# Patient Record
Sex: Female | Born: 1935 | Race: White | Hispanic: No | Smoking: Never smoker
Health system: Southern US, Community
[De-identification: ages and names within clinical notes are randomized; demographics above are authoritative.]

## PROBLEM LIST (undated history)

## (undated) DIAGNOSIS — J449 Chronic obstructive pulmonary disease, unspecified: Secondary | ICD-10-CM

## (undated) DIAGNOSIS — I10 Essential (primary) hypertension: Secondary | ICD-10-CM

## (undated) DIAGNOSIS — F0390 Unspecified dementia without behavioral disturbance: Secondary | ICD-10-CM

## (undated) DIAGNOSIS — M810 Age-related osteoporosis without current pathological fracture: Secondary | ICD-10-CM

## (undated) HISTORY — PX: BACK SURGERY: SHX140

---

## 2005-05-29 ENCOUNTER — Ambulatory Visit: Payer: Self-pay | Admitting: Unknown Physician Specialty

## 2007-09-08 ENCOUNTER — Ambulatory Visit: Payer: Self-pay | Admitting: Unknown Physician Specialty

## 2007-09-12 ENCOUNTER — Ambulatory Visit: Payer: Self-pay | Admitting: Unknown Physician Specialty

## 2007-09-19 ENCOUNTER — Ambulatory Visit: Payer: Self-pay | Admitting: Unknown Physician Specialty

## 2008-03-25 ENCOUNTER — Ambulatory Visit: Payer: Self-pay | Admitting: Cardiology

## 2008-03-25 ENCOUNTER — Ambulatory Visit: Payer: Self-pay | Admitting: Ophthalmology

## 2008-04-05 ENCOUNTER — Ambulatory Visit: Payer: Self-pay | Admitting: Ophthalmology

## 2008-05-30 ENCOUNTER — Emergency Department: Payer: Self-pay | Admitting: Emergency Medicine

## 2008-08-26 ENCOUNTER — Inpatient Hospital Stay: Payer: Self-pay | Admitting: Internal Medicine

## 2008-12-12 ENCOUNTER — Inpatient Hospital Stay: Payer: Self-pay | Admitting: Internal Medicine

## 2009-03-13 ENCOUNTER — Inpatient Hospital Stay: Payer: Self-pay | Admitting: Internal Medicine

## 2009-07-27 ENCOUNTER — Ambulatory Visit: Payer: Self-pay | Admitting: Ophthalmology

## 2010-04-09 ENCOUNTER — Inpatient Hospital Stay: Payer: Self-pay | Admitting: Internal Medicine

## 2010-07-31 ENCOUNTER — Inpatient Hospital Stay: Payer: Self-pay | Admitting: Internal Medicine

## 2010-08-07 ENCOUNTER — Encounter: Payer: Self-pay | Admitting: Internal Medicine

## 2010-08-08 ENCOUNTER — Encounter: Payer: Self-pay | Admitting: Internal Medicine

## 2010-08-20 ENCOUNTER — Encounter: Payer: Self-pay | Admitting: Internal Medicine

## 2010-09-19 ENCOUNTER — Encounter: Payer: Self-pay | Admitting: Internal Medicine

## 2012-03-03 ENCOUNTER — Emergency Department: Payer: Self-pay | Admitting: Emergency Medicine

## 2012-03-12 ENCOUNTER — Ambulatory Visit: Payer: Self-pay | Admitting: Orthopedic Surgery

## 2012-09-10 ENCOUNTER — Emergency Department: Payer: Self-pay | Admitting: Emergency Medicine

## 2012-09-10 LAB — TROPONIN I: Troponin-I: <0.02 ng/mL

## 2012-09-10 LAB — CBC
HCT: 40.6 % (ref 35.0–47.0)
HGB: 13.6 g/dL (ref 12.0–16.0)
MCH: 29.7 pg (ref 26.0–34.0)
Platelet: 273 10*3/uL (ref 150–440)
RBC: 4.58 10*6/uL (ref 3.80–5.20)
WBC: 9.5 10*3/uL (ref 3.6–11.0)

## 2012-09-10 LAB — COMPREHENSIVE METABOLIC PANEL
Albumin: 3.6 g/dL (ref 3.4–5.0)
Alkaline Phosphatase: 84 U/L (ref 50–136)
Anion Gap: 5 — ABNORMAL LOW (ref 7–16)
Bilirubin,Total: 0.5 mg/dL (ref 0.2–1.0)
Calcium, Total: 9.1 mg/dL (ref 8.5–10.1)
Chloride: 108 mmol/L — ABNORMAL HIGH (ref 98–107)
Co2: 28 mmol/L (ref 21–32)
Creatinine: 0.89 mg/dL (ref 0.60–1.30)
EGFR (Non-African Amer.): >60
Osmolality: 285 (ref 275–301)
Potassium: 3.7 mmol/L (ref 3.5–5.1)
SGOT(AST): 21 U/L (ref 15–37)
SGPT (ALT): 17 U/L (ref 12–78)
Sodium: 141 mmol/L (ref 136–145)

## 2012-09-10 LAB — URINALYSIS, COMPLETE
Glucose,UR: NEGATIVE mg/dL (ref 0–75)
Ketone: NEGATIVE
Leukocyte Esterase: NEGATIVE
Nitrite: NEGATIVE
Ph: 7 (ref 4.5–8.0)
Protein: NEGATIVE
RBC,UR: 1 /HPF (ref 0–5)
Specific Gravity: 1.009 (ref 1.003–1.030)
Squamous Epithelial: NONE SEEN
WBC UR: 1 /HPF (ref 0–5)

## 2012-09-10 LAB — CK TOTAL AND CKMB (NOT AT ARMC)
CK, Total: 105 U/L (ref 21–215)
CK-MB: 2.6 ng/mL (ref 0.5–3.6)

## 2012-09-11 DIAGNOSIS — S129XXA Fracture of neck, unspecified, initial encounter: Secondary | ICD-10-CM | POA: Insufficient documentation

## 2012-09-11 DIAGNOSIS — J449 Chronic obstructive pulmonary disease, unspecified: Secondary | ICD-10-CM | POA: Insufficient documentation

## 2012-09-11 DIAGNOSIS — I10 Essential (primary) hypertension: Secondary | ICD-10-CM | POA: Insufficient documentation

## 2014-06-04 ENCOUNTER — Emergency Department: Payer: Self-pay | Admitting: Emergency Medicine

## 2015-03-02 ENCOUNTER — Encounter: Payer: Self-pay | Admitting: Emergency Medicine

## 2015-03-02 ENCOUNTER — Emergency Department: Payer: Medicare Other

## 2015-03-02 ENCOUNTER — Emergency Department
Admission: EM | Admit: 2015-03-02 | Discharge: 2015-03-02 | Disposition: A | Discharge status: Home / Self Care | Payer: Medicare Other | Attending: Emergency Medicine | Admitting: Emergency Medicine

## 2015-03-02 DIAGNOSIS — S22089A Unspecified fracture of T11-T12 vertebra, initial encounter for closed fracture: Secondary | ICD-10-CM | POA: Insufficient documentation

## 2015-03-02 DIAGNOSIS — Y998 Other external cause status: Secondary | ICD-10-CM | POA: Diagnosis not present

## 2015-03-02 DIAGNOSIS — W01198A Fall on same level from slipping, tripping and stumbling with subsequent striking against other object, initial encounter: Secondary | ICD-10-CM | POA: Insufficient documentation

## 2015-03-02 DIAGNOSIS — I10 Essential (primary) hypertension: Secondary | ICD-10-CM | POA: Diagnosis not present

## 2015-03-02 DIAGNOSIS — N210 Calculus in bladder: Secondary | ICD-10-CM

## 2015-03-02 DIAGNOSIS — S22080A Wedge compression fracture of T11-T12 vertebra, initial encounter for closed fracture: Secondary | ICD-10-CM

## 2015-03-02 DIAGNOSIS — Y9389 Activity, other specified: Secondary | ICD-10-CM | POA: Diagnosis not present

## 2015-03-02 DIAGNOSIS — W19XXXA Unspecified fall, initial encounter: Secondary | ICD-10-CM

## 2015-03-02 DIAGNOSIS — S322XXA Fracture of coccyx, initial encounter for closed fracture: Secondary | ICD-10-CM | POA: Insufficient documentation

## 2015-03-02 DIAGNOSIS — Y9289 Other specified places as the place of occurrence of the external cause: Secondary | ICD-10-CM | POA: Diagnosis not present

## 2015-03-02 DIAGNOSIS — S3992XA Unspecified injury of lower back, initial encounter: Secondary | ICD-10-CM | POA: Diagnosis present

## 2015-03-02 HISTORY — DX: Essential (primary) hypertension: I10

## 2015-03-02 HISTORY — DX: Age-related osteoporosis without current pathological fracture: M81.0

## 2015-03-02 HISTORY — DX: Unspecified dementia, unspecified severity, without behavioral disturbance, psychotic disturbance, mood disturbance, and anxiety: F03.90

## 2015-03-02 MED ORDER — TRAMADOL HCL 50 MG PO TABS: 50.0000 mg | ORAL_TABLET | Freq: Four times a day (QID) | ORAL | Status: DC | PRN

## 2015-03-02 MED ORDER — TRAMADOL HCL 50 MG PO TABS
50.0000 mg | ORAL_TABLET | Freq: Once | ORAL | Status: AC
  Administered 2015-03-02: 50 mg via ORAL
  Filled 2015-03-02: qty 1

## 2015-03-02 NOTE — ED Notes (Signed)
Pt comes into the ED via POV c/o fall this morning where the patient fell on her tailbone.  Patient is c/o generalized pain on the tailbone, lower back, and legs.

## 2015-03-02 NOTE — ED Provider Notes (Signed)
[facility] Emergency Department Provider Note  ____________________________________________  Time seen: Approximately 2:34 PM  I have reviewed the triage vital signs and the nursing notes.   HISTORY  Chief Complaint Fall   HPI Katherine Bartlett is a 81 y.o. female who presents to the emergency department for evaluation of lower back pain and tailbone pain after a fall this morning about 7:00. Her caretaker states that she has dementia and frequently loses her balance. She states that while folding laundry today she just lost her balance and fell straight down, hitting her tailbone on a carpeted floor.The patient states "I'm just sore all over." The caregiver gave her Tylenol about 7:30 this morning without any significant relief of pain.   Past Medical History  Diagnosis Date  . Dementia   . Osteoporosis   . Hypertension     There are no active problems to display for this patient.   Past Surgical History  Procedure Laterality Date  . Back surgery      No current outpatient prescriptions on file.  Allergies Review of patient's allergies indicates no known allergies.  No family history on file.  Social History Social History  Substance Use Topics  . Smoking status: Never Smoker   . Smokeless tobacco: None  . Alcohol Use: No    Review of Systems Constitutional: No recent illness. Eyes: No visual changes. ENT: No sore throat. Cardiovascular: Denies chest pain or palpitations. Respiratory: Denies shortness of breath. Gastrointestinal: No abdominal pain.  Genitourinary: Negative for dysuria. Musculoskeletal: Pain in lower back and tailbone. Skin: Negative for rash. Neurological: Negative for headaches, focal weakness or numbness. 10-point ROS otherwise negative.  ____________________________________________   PHYSICAL EXAM:  VITAL SIGNS: ED Triage Vitals  Enc Vitals Group     BP 03/02/15 1329 118/55 mmHg     Pulse Rate  03/02/15 1329 81     Resp 03/02/15 1329 18     Temp 03/02/15 1329 98.6 F (37 C)     Temp Source 03/02/15 1329 Oral     SpO2 03/02/15 1329 97 %     Weight 03/02/15 1329 128 lb (58.06 kg)     Height 03/02/15 1329  (1.626 m)     Head Cir --      Peak Flow --      Pain Score 03/02/15 1329 7     Pain Loc --      Pain Edu? --      Excl. in GC? --     Constitutional: Alert and oriented. Well appearing and in no acute distress. Eyes: Conjunctivae are normal. EOMI. Head: Atraumatic. Nose: No congestion/rhinnorhea. Neck: No stridor.  Respiratory: Normal respiratory effort.   Musculoskeletal: Tenderness noted over the lower lumbar, sacrum and coccyx areas. No tenderness to exam of either hip or femur. Nexus criteria negative. Neurologic:  Normal speech and language. No gross focal neurologic deficits are appreciated. Speech is normal. No gait instability. Skin:  Skin is warm, dry and intact. Atraumatic. Psychiatric: Mood and affect are normal. Speech and behavior are normal.  ____________________________________________   LABS (all labs ordered are listed, but only abnormal results are displayed)  Labs Reviewed - No data to display ____________________________________________  RADIOLOGY  Sacrum and coccyx films show a transverse fracture through the third sacral segment per radiology. There is also a 2.1 Bladder Stone Noted  Lumbar spine plain film shows a old compression fracture of T11 and a slight compression fracture of T12. There is severe facet arthritis  at L4-5 and L5-S1.  I, Kem Boroughsari Raeley Gilmore, personally viewed and evaluated these images (plain radiographs) as part of my medical decision making.   ____________________________________________   PROCEDURES  Procedure(s) performed: None   ____________________________________________   INITIAL IMPRESSION / ASSESSMENT AND PLAN / ED COURSE  Pertinent labs & imaging results that were available during my care of the  patient were reviewed by me and considered in my medical decision making (see chart for details).  Patient was advised to follow-up with urology to further evaluate bladder stone.  She was advised to follow-up with orthopedics for the coccyx fracture as well as the arthritis and old compression fractures.   She was advised to return to the emergency department for symptoms of concern if unable to schedule an appointment with primary care or the specialist.  ____________________________________________   FINAL CLINICAL IMPRESSION(S) / ED DIAGNOSES  Final diagnoses:  Tammy SoursFall       Maclaine Ahola B Rush Salce, FNP 03/02/15 1628  Sharman CheekPhillip Stafford, MD 03/03/15 2126

## 2015-03-02 NOTE — Discharge Instructions (Signed)
Call to schedule a follow up with orthopedics. Call to schedule an appointment with the urologist. A bladder stone was identified on the x-ray. Return to the ER for symptoms that change or worsen if unable to schedule an appointment with the primary care provider or specialist.

## 2015-03-14 ENCOUNTER — Encounter: Payer: Self-pay | Admitting: *Deleted

## 2015-03-14 ENCOUNTER — Encounter: Payer: Self-pay | Admitting: Obstetrics and Gynecology

## 2015-03-14 ENCOUNTER — Ambulatory Visit: Payer: Self-pay | Admitting: Obstetrics and Gynecology

## 2015-06-17 ENCOUNTER — Encounter: Payer: Self-pay | Admitting: Emergency Medicine

## 2015-06-17 ENCOUNTER — Emergency Department
Admission: EM | Admit: 2015-06-17 | Discharge: 2015-06-17 | Disposition: A | Discharge status: Home / Self Care | Payer: Medicare Other | Attending: Emergency Medicine | Admitting: Emergency Medicine

## 2015-06-17 ENCOUNTER — Emergency Department: Payer: Medicare Other

## 2015-06-17 DIAGNOSIS — R410 Disorientation, unspecified: Secondary | ICD-10-CM

## 2015-06-17 DIAGNOSIS — F0390 Unspecified dementia without behavioral disturbance: Secondary | ICD-10-CM | POA: Diagnosis not present

## 2015-06-17 DIAGNOSIS — Y998 Other external cause status: Secondary | ICD-10-CM | POA: Diagnosis not present

## 2015-06-17 DIAGNOSIS — N390 Urinary tract infection, site not specified: Secondary | ICD-10-CM

## 2015-06-17 DIAGNOSIS — W1839XA Other fall on same level, initial encounter: Secondary | ICD-10-CM | POA: Diagnosis not present

## 2015-06-17 DIAGNOSIS — I10 Essential (primary) hypertension: Secondary | ICD-10-CM | POA: Insufficient documentation

## 2015-06-17 DIAGNOSIS — Y9389 Activity, other specified: Secondary | ICD-10-CM | POA: Diagnosis not present

## 2015-06-17 DIAGNOSIS — R4182 Altered mental status, unspecified: Secondary | ICD-10-CM | POA: Diagnosis present

## 2015-06-17 DIAGNOSIS — Y9289 Other specified places as the place of occurrence of the external cause: Secondary | ICD-10-CM | POA: Insufficient documentation

## 2015-06-17 DIAGNOSIS — S6991XA Unspecified injury of right wrist, hand and finger(s), initial encounter: Secondary | ICD-10-CM | POA: Insufficient documentation

## 2015-06-17 LAB — CBC
HEMATOCRIT: 38.8 % (ref 35.0–47.0)
HEMOGLOBIN: 13 g/dL (ref 12.0–16.0)
MCH: 29.1 pg (ref 26.0–34.0)
MCHC: 33.5 g/dL (ref 32.0–36.0)
MCV: 86.8 fL (ref 80.0–100.0)
Platelets: 295 10*3/uL (ref 150–440)
RBC: 4.47 MIL/uL (ref 3.80–5.20)
RDW: 14.5 % (ref 11.5–14.5)
WBC: 9.5 10*3/uL (ref 3.6–11.0)

## 2015-06-17 LAB — COMPREHENSIVE METABOLIC PANEL
ALK PHOS: 85 U/L (ref 38–126)
ALT: 15 U/L (ref 14–54)
AST: 19 U/L (ref 15–41)
Albumin: 3.5 g/dL (ref 3.5–5.0)
Anion gap: 5 (ref 5–15)
BILIRUBIN TOTAL: 0.9 mg/dL (ref 0.3–1.2)
BUN: 18 mg/dL (ref 6–20)
CALCIUM: 8.7 mg/dL — AB (ref 8.9–10.3)
CO2: 27 mmol/L (ref 22–32)
CREATININE: 0.66 mg/dL (ref 0.44–1.00)
Chloride: 106 mmol/L (ref 101–111)
GFR calc Af Amer: >60 mL/min (ref >60)
Glucose, Bld: 95 mg/dL (ref 65–99)
Potassium: 4 mmol/L (ref 3.5–5.1)
Sodium: 138 mmol/L (ref 135–145)
Total Protein: 6.5 g/dL (ref 6.5–8.1)

## 2015-06-17 LAB — URINALYSIS COMPLETE WITH MICROSCOPIC (ARMC ONLY)
BACTERIA UA: NONE SEEN
Bilirubin Urine: NEGATIVE
GLUCOSE, UA: NEGATIVE mg/dL
Hgb urine dipstick: NEGATIVE
Ketones, ur: NEGATIVE mg/dL
Nitrite: NEGATIVE
PROTEIN: 30 mg/dL — AB
Specific Gravity, Urine: 1.014 (ref 1.005–1.030)
pH: 7 (ref 5.0–8.0)

## 2015-06-17 LAB — CK: Total CK: 128 U/L (ref 38–234)

## 2015-06-17 LAB — TROPONIN I

## 2015-06-17 MED ORDER — CEPHALEXIN 500 MG PO CAPS
500.0000 mg | ORAL_CAPSULE | Freq: Once | ORAL | Status: AC
  Administered 2015-06-17: 500 mg via ORAL
  Filled 2015-06-17: qty 1

## 2015-06-17 MED ORDER — CEPHALEXIN 500 MG PO CAPS: 500.0000 mg | ORAL_CAPSULE | Freq: Three times a day (TID) | ORAL | Status: DC

## 2015-06-17 NOTE — ED Notes (Signed)
Pt presents to the ER via EMS from home. EMS states that daughter found patient on the floor with complaint of right wrist pain . EMS states " smells like UTI".  Pt with hx of dementia.

## 2015-06-17 NOTE — ED Notes (Addendum)
Pt combative at the time of giving oral food and meds. Pt spits meds and food out of mouth. MD aware. Fall risk alarm attached.

## 2015-06-17 NOTE — ED Provider Notes (Signed)
[facility] Emergency Department Provider Note  Time seen: 3:40 PM  I have reviewed the triage vital signs and the nursing notes.   HISTORY  Chief Complaint Altered Mental Status and Fall    HPI Katherine Bartlett is a 82 y.o. female with a past medical history of dementia, hypertension, osteoporosis, who presents to the emergency department after a fall. Per the daughter the patient has very mild dementia, always knows where she is, who she is, who the daughter is, can converse normally. She states today the patient has been very confused, did not recognize her own daughter which is a large change from her baseline per the daughter. She states she went to the grocery store and upon returning the patient had tried to get out of bed herself and was on the ground next to the bed unable to get up. She was complaining of right wrist pain at that time.Daughter also states for the past 2 days she has had a very strong urine odor. Patient is not able to give a history in the emergency department but denies any discomfort.     Past Medical History  Diagnosis Date  . Dementia   . Osteoporosis   . Hypertension     Patient Active Problem List   Diagnosis Date Noted  . Fracture of cervical vertebra (HCC) 09/11/2012  . Chronic obstructive pulmonary disease (HCC) 09/11/2012  . BP (high blood pressure) 09/11/2012    Past Surgical History  Procedure Laterality Date  . Back surgery      Current Outpatient Rx  Name  Route  Sig  Dispense  Refill  . traMADol (ULTRAM) 50 MG tablet   Oral   Take 1 tablet (50 mg total) by mouth every 6 (six) hours as needed.   9 tablet   0     Allergies Review of patient's allergies indicates no known allergies.  History reviewed. No pertinent family history.  Social History Social History  Substance Use Topics  . Smoking status: Never Smoker   . Smokeless tobacco: None  . Alcohol Use: No    Review of Systems Unable to  obtain an accurate/appropriate review of systems given dementia and altered mental status.  ____________________________________________   PHYSICAL EXAM:  VITAL SIGNS: ED Triage Vitals  Enc Vitals Group     BP 06/17/15 1407 140/74 mmHg     Pulse Rate 06/17/15 1407 87     Resp --      Temp 06/17/15 1407 99.2 F (37.3 C)     Temp Source 06/17/15 1407 Axillary     SpO2 06/17/15 1407 97 %     Weight 06/17/15 1407 135 lb (61.236 kg)     Height 06/17/15 1407  (1.6 m)     Head Cir --      Peak Flow --      Pain Score --      Pain Loc --      Pain Edu? --      Excl. in GC? --     Constitutional: Alert. Calm/pleasant, will follow basic commands, will occasionally answer questions with simple answers which are inaccurate at times. Eyes: Normal exam ENT   Head: Normocephalic and atraumatic   Mouth/Throat: Mucous membranes are moist. Cardiovascular: Normal rate, regular rhythm. No murmur Respiratory: Normal respiratory effort without tachypnea nor retractions. Breath sounds are clear  Gastrointestinal: Soft and nontender. No distention.  Musculoskeletal: Mild tenderness palpation of the right wrist, patient grimaces with range of  motion. Stable pelvis, good range of motion in hips and lower extremities, good range of motion left upper extremity without apparent discomfort. Neurologic:  Patient largely nonverbal will occasionally speak yes or no to answer questions with questionable accuracy. Does appear to move all extremities. Skin:  Skin is warm, dry and intact.  ____________________________________________   RADIOLOGY  Right wrist x-ray shows soft tissue swelling without fracture. CT head shows no acute abnormality.   ____________________________________________   INITIAL IMPRESSION / ASSESSMENT AND PLAN / ED COURSE  Pertinent labs & imaging results that were available during my care of the patient were reviewed by me and considered in my medical decision making  (see chart for details).  Patient presents after a fall, unwitnessed at home. Daughter states the patient has dementia but appears to be more confused today. Also states a strong urine smell. We will check labs, CT head, right wrist x-ray, and closely monitor in the emergency department.  Urine has resulted in a mild urinary tract infection, 6-30 RBCs, 6-30 WBCs with trace leukocytes. Labs otherwise within normal limits. We'll start the patient on Keflex. I discussed with the hospitalist, they will come see the patient states she likely does not require admission to the hospital, I agree with this assessment. Currently awaiting for the daughter returned to discuss plan of care for the patient. I believe the patient could safely be discharged home on Keflex and follow-up with a primary care physician as long as the daughter is able to adequately care for the patient at home.  ____________________________________________   FINAL CLINICAL IMPRESSION(S) / ED DIAGNOSES  Fall UTI   Minna Antis, MD 06/17/15 [phone]

## 2015-06-17 NOTE — ED Provider Notes (Addendum)
-----------------------------------------   7:19 PM on 06/17/2015 -----------------------------------------  Patient has been sitting with crossed legs reading a magazine. She was able to eat the applesauce there is no evidence of CVA. The hospitalist did not wish to admit the patient at this time given her reassuring workup and exam. This is not on reasonable. I talked to the daughter , who states the patient is at her baseline she is very comfortable bringing her home. Patient and daughter would prefer not to be admitted. We will discharge her with close outpatient follow-up as already indicated by prior physician.  Jeanmarie Plant, MD 06/17/15 1919  Jeanmarie Plant, MD 06/17/15 [phone]

## 2015-06-17 NOTE — Discharge Instructions (Signed)
Confusion °Confusion is the inability to think with your usual speed or clarity. Confusion may come on quickly or slowly over time. How quickly the confusion comes on depends on the cause. Confusion can be due to any number of causes. °CAUSES  °· Concussion, head injury, or head trauma. °· Seizures. °· Stroke. °· Fever. °· Brain tumor. °· Age related decreased brain function (dementia). °· Heightened emotional states like rage or terror. °· Mental illness in which the person loses the ability to determine what is real and what is not (hallucinations). °· Infections such as a urinary tract infection (UTI). °· Toxic effects from alcohol, drugs, or prescription medicines. °· Dehydration and an imbalance of salts in the body (electrolytes). °· Lack of sleep. °· Low blood sugar (diabetes). °· Low levels of oxygen from conditions such as chronic lung disorders. °· Drug interactions or other medicine side effects. °· Nutritional deficiencies, especially niacin, thiamine, vitamin C, or vitamin B. °· Sudden drop in body temperature (hypothermia). °· Change in routine, such as when traveling or hospitalized. °SIGNS AND SYMPTOMS  °People often describe their thinking as cloudy or unclear when they are confused. Confusion can also include feeling disoriented. That means you are unaware of where or who you are. You may also not know what the date or time is. If confused, you may also have difficulty paying attention, remembering, and making decisions. Some people also act aggressively when they are confused.  °DIAGNOSIS  °The medical evaluation of confusion may include: °· Blood and urine tests. °· X-rays. °· Brain and nervous system tests. °· Analyzing your brain waves (electroencephalogram or EEG). °· Magnetic resonance imaging (MRI) of your head. °· Computed tomography (CT) scan of your head. °· Mental status tests in which your health care provider may ask many questions. Some of these questions may seem silly or strange,  but they are a very important test to help diagnose and treat confusion. °TREATMENT  °An admission to the hospital may not be needed, but a person with confusion should not be left alone. Stay with a family member or friend until the confusion clears. Avoid alcohol, pain relievers, or sedative drugs until you have fully recovered. Do not drive until directed by your health care provider. °HOME CARE INSTRUCTIONS  °What family and friends can do: °· To find out if someone is confused, ask the person to state his or her name, age, and the date. If the person is unsure or answers incorrectly, he or she is confused. °· Always introduce yourself, no matter how well the person knows you. °· Often remind the person of his or her location. °· Place a calendar and clock near the confused person. °· Help the person with his or her medicines. You may want to use a pill box, an alarm as a reminder, or give the person each dose as prescribed. °· Talk about current events and plans for the day. °· Try to keep the environment calm, quiet, and peaceful. °· Make sure the person keeps follow-up visits with his or her health care provider. °PREVENTION  °Ways to prevent confusion: °· Avoid alcohol. °· Eat a balanced diet. °· Get enough sleep. °· Take medicine only as directed by your health care provider. °· Do not become isolated. Spend time with other people and make plans for your days. °· Keep careful watch on your blood sugar levels if you are diabetic. °SEEK IMMEDIATE MEDICAL CARE IF:  °· You develop severe headaches, repeated vomiting, seizures, blackouts, or   slurred speech. °· There is increasing confusion, weakness, numbness, restlessness, or personality changes. °· You develop a loss of balance, have marked dizziness, feel uncoordinated, or fall. °· You have delusions, hallucinations, or develop severe anxiety. °· Your family members think you need to be rechecked. °  °This information is not intended to replace advice given  to you by your health care provider. Make sure you discuss any questions you have with your health care provider. °  °Document Released: 06/14/2004 Document Revised: 05/28/2014 Document Reviewed: 06/12/2013 °Elsevier Interactive Patient Education ©2016 [employer] °Urinary Tract Infection °Urinary tract infections (UTIs) can develop anywhere along your urinary tract. Your urinary tract is your body's drainage system for removing wastes and extra water. Your urinary tract includes two kidneys, two ureters, a bladder, and a urethra. Your kidneys are a pair of bean-shaped organs. Each kidney is about the size of your fist. They are located below your ribs, one on each side of your spine. °CAUSES °Infections are caused by microbes, which are microscopic organisms, including fungi, viruses, and bacteria. These organisms are so small that they can only be seen through a microscope. Bacteria are the microbes that most commonly cause UTIs. °SYMPTOMS  °Symptoms of UTIs may vary by age and gender of the patient and by the location of the infection. Symptoms in young women typically include a frequent and intense urge to urinate and a painful, burning feeling in the bladder or urethra during urination. Older women and men are more likely to be tired, shaky, and weak and have muscle aches and abdominal pain. A fever may mean the infection is in your kidneys. Other symptoms of a kidney infection include pain in your back or sides below the ribs, nausea, and vomiting. °DIAGNOSIS °To diagnose a UTI, your caregiver will ask you about your symptoms. Your caregiver will also ask you to provide a urine sample. The urine sample will be tested for bacteria and white blood cells. White blood cells are made by your body to help fight infection. °TREATMENT  °Typically, UTIs can be treated with medication. Because most UTIs are caused by a bacterial infection, they usually can be treated with the use of antibiotics. The choice of  antibiotic and length of treatment depend on your symptoms and the type of bacteria causing your infection. °HOME CARE INSTRUCTIONS °· If you were prescribed antibiotics, take them exactly as your caregiver instructs you. Finish the medication even if you feel better after you have only taken some of the medication. °· Drink enough water and fluids to keep your urine clear or pale yellow. °· Avoid caffeine, tea, and carbonated beverages. They tend to irritate your bladder. °· Empty your bladder often. Avoid holding urine for long periods of time. °· Empty your bladder before and after sexual intercourse. °· After a bowel movement, women should cleanse from front to back. Use each tissue only once. °SEEK MEDICAL CARE IF:  °· You have back pain. °· You develop a fever. °· Your symptoms do not begin to resolve within 3 days. °SEEK IMMEDIATE MEDICAL CARE IF:  °· You have severe back pain or lower abdominal pain. °· You develop chills. °· You have nausea or vomiting. °· You have continued burning or discomfort with urination. °MAKE SURE YOU:  °· Understand these instructions. °· Will watch your condition. °· Will get help right away if you are not doing well or get worse. °  °This information is not intended to replace advice given to you by   your health care provider. Make sure you discuss any questions you have with your health care provider. °  °Document Released: 02/14/2005 Document Revised: 01/26/2015 Document Reviewed: 06/15/2011 °Elsevier Interactive Patient Education ©2016 [employer] Inc[employer] ° °

## 2015-06-19 LAB — URINE CULTURE

## 2015-06-20 NOTE — Progress Notes (Signed)
ED Antimicrobial Stewardship Positive Culture Follow Up   Katherine Bartlett is an 82 y.o. female who presented to [facility] on 06/17/2015 with a chief complaint of  Chief Complaint  Patient presents with  . Altered Mental Status  . Fall    Recent Results (from the past 720 hour(s))  Urine culture     Status: None   Collection Time: 06/17/15  3:21 PM  Result Value Ref Range Status   Specimen Description URINE, RANDOM  Final   Special Requests NONE  Final   Culture   Final    >=100,000 COLONIES/mL ESCHERICHIA COLI Results Called to: [name] AT 1222 06/19/15 DV    Report Status 06/19/2015 FINAL  Final   Organism ID, Bacteria ESCHERICHIA COLI  Final      Susceptibility   Escherichia coli - MIC*    AMPICILLIN >=32 RESISTANT Resistant     CEFAZOLIN >=64 RESISTANT Resistant     CEFTRIAXONE 32 RESISTANT Resistant     CIPROFLOXACIN >=4 RESISTANT Resistant     GENTAMICIN <=1 SENSITIVE Sensitive     IMIPENEM <=0.25 SENSITIVE Sensitive     NITROFURANTOIN <=16 SENSITIVE Sensitive     TRIMETH/SULFA >=320 RESISTANT Resistant     AMPICILLIN/SULBACTAM 4 SENSITIVE Sensitive     PIP/TAZO <=4 SENSITIVE Sensitive     * >=100,000 COLONIES/mL ESCHERICHIA COLI     Treated with cephalexin, organism resistant to prescribed antimicrobial  Patient discharged originally without antimicrobial agent and treatment is now indicated  PCP (Dr. Dewaine Oats) contacted regarding urine culture results.  Made office aware that patient discharged on cephalexin and organism is resistant.  May consider Augmentin 500/125 mg po BID x 7 days.     Rashidah Belleville G 06/20/2015, 3:36 PM Infectious Diseases Pharmacist Phone# [phone]

## 2015-07-14 ENCOUNTER — Emergency Department: Payer: Medicare Other

## 2015-07-14 ENCOUNTER — Inpatient Hospital Stay
Admission: EM | Admit: 2015-07-14 | Discharge: 2015-07-17 | DRG: 871 | Disposition: A | Discharge status: Home Health | Payer: Medicare Other | Attending: Internal Medicine | Admitting: Internal Medicine

## 2015-07-14 DIAGNOSIS — A419 Sepsis, unspecified organism: Secondary | ICD-10-CM

## 2015-07-14 DIAGNOSIS — J449 Chronic obstructive pulmonary disease, unspecified: Secondary | ICD-10-CM | POA: Diagnosis present

## 2015-07-14 DIAGNOSIS — F0390 Unspecified dementia without behavioral disturbance: Secondary | ICD-10-CM | POA: Diagnosis present

## 2015-07-14 DIAGNOSIS — J9610 Chronic respiratory failure, unspecified whether with hypoxia or hypercapnia: Secondary | ICD-10-CM | POA: Diagnosis present

## 2015-07-14 DIAGNOSIS — M810 Age-related osteoporosis without current pathological fracture: Secondary | ICD-10-CM | POA: Diagnosis present

## 2015-07-14 DIAGNOSIS — G9340 Encephalopathy, unspecified: Secondary | ICD-10-CM | POA: Diagnosis present

## 2015-07-14 DIAGNOSIS — N390 Urinary tract infection, site not specified: Secondary | ICD-10-CM | POA: Diagnosis present

## 2015-07-14 DIAGNOSIS — Z9981 Dependence on supplemental oxygen: Secondary | ICD-10-CM

## 2015-07-14 DIAGNOSIS — Z79899 Other long term (current) drug therapy: Secondary | ICD-10-CM

## 2015-07-14 DIAGNOSIS — A4151 Sepsis due to Escherichia coli [E. coli]: Secondary | ICD-10-CM | POA: Diagnosis present

## 2015-07-14 DIAGNOSIS — I10 Essential (primary) hypertension: Secondary | ICD-10-CM | POA: Diagnosis present

## 2015-07-14 DIAGNOSIS — E876 Hypokalemia: Secondary | ICD-10-CM | POA: Diagnosis present

## 2015-07-14 HISTORY — DX: Chronic obstructive pulmonary disease, unspecified: J44.9

## 2015-07-14 LAB — CBC WITH DIFFERENTIAL/PLATELET
Basophils Absolute: 0 10*3/uL (ref 0–0.1)
Basophils Relative: 0 %
EOS PCT: 0 %
Eosinophils Absolute: 0 10*3/uL (ref 0–0.7)
HCT: 37.8 % (ref 35.0–47.0)
Hemoglobin: 12.7 g/dL (ref 12.0–16.0)
LYMPHS ABS: 0.1 10*3/uL — AB (ref 1.0–3.6)
Lymphocytes Relative: 2 %
MCH: 29.6 pg (ref 26.0–34.0)
MCHC: 33.5 g/dL (ref 32.0–36.0)
MCV: 88.4 fL (ref 80.0–100.0)
MONO ABS: 0.1 10*3/uL — AB (ref 0.2–0.9)
MONOS PCT: 1 %
NEUTROS ABS: 7.1 10*3/uL — AB (ref 1.4–6.5)
Neutrophils Relative %: 97 %
PLATELETS: 188 10*3/uL (ref 150–440)
RBC: 4.28 MIL/uL (ref 3.80–5.20)
RDW: 14.2 % (ref 11.5–14.5)
WBC: 7.3 10*3/uL (ref 3.6–11.0)

## 2015-07-14 LAB — COMPREHENSIVE METABOLIC PANEL
ALK PHOS: 78 U/L (ref 38–126)
ALT: 14 U/L (ref 14–54)
AST: 26 U/L (ref 15–41)
Albumin: 3.5 g/dL (ref 3.5–5.0)
Anion gap: 8 (ref 5–15)
BUN: 25 mg/dL — AB (ref 6–20)
CALCIUM: 8.5 mg/dL — AB (ref 8.9–10.3)
CHLORIDE: 111 mmol/L (ref 101–111)
CO2: 24 mmol/L (ref 22–32)
CREATININE: 0.97 mg/dL (ref 0.44–1.00)
GFR, EST NON AFRICAN AMERICAN: 53 mL/min — AB (ref >60)
Glucose, Bld: 181 mg/dL — ABNORMAL HIGH (ref 65–99)
Potassium: 3.4 mmol/L — ABNORMAL LOW (ref 3.5–5.1)
SODIUM: 143 mmol/L (ref 135–145)
Total Bilirubin: 0.5 mg/dL (ref 0.3–1.2)
Total Protein: 6.3 g/dL — ABNORMAL LOW (ref 6.5–8.1)

## 2015-07-14 LAB — URINALYSIS COMPLETE WITH MICROSCOPIC (ARMC ONLY)
BILIRUBIN URINE: NEGATIVE
GLUCOSE, UA: NEGATIVE mg/dL
KETONES UR: NEGATIVE mg/dL
Nitrite: POSITIVE — AB
PROTEIN: 30 mg/dL — AB
SPECIFIC GRAVITY, URINE: 1.009 (ref 1.005–1.030)
pH: 6 (ref 5.0–8.0)

## 2015-07-14 LAB — LACTIC ACID, PLASMA: Lactic Acid, Venous: 2.7 mmol/L (ref 0.5–2.0)

## 2015-07-14 LAB — TROPONIN I: Troponin I: <0.03 ng/mL (ref <0.031)

## 2015-07-14 MED ORDER — BISACODYL 5 MG PO TBEC: 5.0000 mg | DELAYED_RELEASE_TABLET | Freq: Every day | ORAL | Status: DC | PRN

## 2015-07-14 MED ORDER — POLYETHYLENE GLYCOL 3350 17 G PO PACK: 17.0000 g | PACK | Freq: Every day | ORAL | Status: DC | PRN

## 2015-07-14 MED ORDER — ENOXAPARIN SODIUM 40 MG/0.4ML ~~LOC~~ SOLN
40.0000 mg | SUBCUTANEOUS | Status: DC
  Administered 2015-07-15 – 2015-07-16 (×2): 40 mg via SUBCUTANEOUS
  Filled 2015-07-14 (×2): qty 0.4

## 2015-07-14 MED ORDER — VANCOMYCIN HCL IN DEXTROSE 1-5 GM/200ML-% IV SOLN
1000.0000 mg | Freq: Once | INTRAVENOUS | Status: AC
  Administered 2015-07-14: 1000 mg via INTRAVENOUS
  Filled 2015-07-14: qty 200

## 2015-07-14 MED ORDER — DEXTROSE 5 % IV SOLN
1.0000 g | Freq: Once | INTRAVENOUS | Status: AC
  Administered 2015-07-14: 1 g via INTRAVENOUS
  Filled 2015-07-14: qty 10

## 2015-07-14 MED ORDER — ACETAMINOPHEN 325 MG PO TABS
650.0000 mg | ORAL_TABLET | Freq: Four times a day (QID) | ORAL | Status: DC | PRN
  Administered 2015-07-16 (×2): 650 mg via ORAL
  Filled 2015-07-14 (×2): qty 2

## 2015-07-14 MED ORDER — ONDANSETRON HCL 4 MG/2ML IJ SOLN: 4.0000 mg | Freq: Four times a day (QID) | INTRAMUSCULAR | Status: DC | PRN

## 2015-07-14 MED ORDER — PIPERACILLIN-TAZOBACTAM 3.375 G IVPB 30 MIN
3.3750 g | Freq: Once | INTRAVENOUS | Status: AC
  Administered 2015-07-14: 3.375 g via INTRAVENOUS
  Filled 2015-07-14: qty 50

## 2015-07-14 MED ORDER — ONDANSETRON HCL 4 MG PO TABS: 4.0000 mg | ORAL_TABLET | Freq: Four times a day (QID) | ORAL | Status: DC | PRN

## 2015-07-14 MED ORDER — DEXTROSE 5 % IV SOLN: 1.0000 g | Freq: Once | INTRAVENOUS | Status: DC

## 2015-07-14 MED ORDER — SODIUM CHLORIDE 0.9 % IV BOLUS (SEPSIS)
1000.0000 mL | INTRAVENOUS | Status: AC
  Administered 2015-07-14 (×2): 1000 mL via INTRAVENOUS

## 2015-07-14 MED ORDER — IPRATROPIUM-ALBUTEROL 0.5-2.5 (3) MG/3ML IN SOLN: 3.0000 mL | RESPIRATORY_TRACT | Status: DC | PRN

## 2015-07-14 MED ORDER — ACETAMINOPHEN 650 MG RE SUPP: 650.0000 mg | Freq: Four times a day (QID) | RECTAL | Status: DC | PRN

## 2015-07-14 NOTE — ED Notes (Signed)
Lab called with Lactic acid level of 2.7 - reported to Dr Derrill Kay

## 2015-07-14 NOTE — ED Provider Notes (Signed)
[facility] Emergency Department Provider Note    ____________________________________________  Time seen: On EMS arrival  I have reviewed the triage vital signs and the nursing notes.   HISTORY  Chief Complaint Fever   History limited by: Confusion   HPI Katherine Bartlett is a 82 y.o. female who presents to the emergency department today via EMS because of concerns for increased confusion and decreased ability to ambulate. EMS states that the patient was treated for UTI roughly 3 weeks ago. The symptoms started today. She had associated symptom of fever. The patient states that she has had some increased confusion.Some concern for some abdominal pain. Patient denies any chest pain. No shortness of breath.    Past Medical History  Diagnosis Date  . Dementia   . Osteoporosis   . Hypertension     Patient Active Problem List   Diagnosis Date Noted  . Fracture of cervical vertebra (HCC) 09/11/2012  . Chronic obstructive pulmonary disease (HCC) 09/11/2012  . BP (high blood pressure) 09/11/2012    Past Surgical History  Procedure Laterality Date  . Back surgery      Current Outpatient Rx  Name  Route  Sig  Dispense  Refill  . albuterol (PROVENTIL HFA;VENTOLIN HFA) 108 (90 Base) MCG/ACT inhaler   Inhalation   Inhale 2 puffs into the lungs every 6 (six) hours as needed for wheezing or shortness of breath.         Marland Kitchen alendronate (FOSAMAX) 70 MG tablet   Oral   Take 70 mg by mouth once a week. Take with a full glass of water on an empty stomach.         Marland Kitchen atenolol (TENORMIN) 25 MG tablet   Oral   Take 25 mg by mouth daily.         . Calcium Carbonate-Vitamin D (CALCIUM 600+D) 600-400 MG-UNIT tablet   Oral   Take 1 tablet by mouth 2 (two) times daily.         . cephALEXin (KEFLEX) 500 MG capsule   Oral   Take 1 capsule (500 mg total) by mouth 3 (three) times daily.   30 capsule   0   . Fluticasone-Salmeterol (ADVAIR) 250-50  MCG/DOSE AEPB   Inhalation   Inhale 1 puff into the lungs 2 (two) times daily.         Marland Kitchen galantamine (RAZADYNE) 8 MG tablet   Oral   Take 8 mg by mouth 2 (two) times daily.         Marland Kitchen tiotropium (SPIRIVA) 18 MCG inhalation capsule   Inhalation   Place 18 mcg into inhaler and inhale daily.         . traMADol (ULTRAM) 50 MG tablet   Oral   Take 1 tablet (50 mg total) by mouth every 6 (six) hours as needed. Patient not taking: Reported on 06/17/2015   9 tablet   0     Allergies Review of patient's allergies indicates no known allergies.  History reviewed. No pertinent family history.  Social History Social History  Substance Use Topics  . Smoking status: Never Smoker   . Smokeless tobacco: None  . Alcohol Use: No    Review of Systems  Constitutional: Positive for fever. Cardiovascular: Negative for chest pain. Respiratory: Negative for shortness of breath. Gastrointestinal: Positive for lower abdominal pain Genitourinary: Negative for dysuria. Neurological: Negative for headaches, focal weakness or numbness.  10-point ROS otherwise negative.  ____________________________________________   PHYSICAL EXAM:  VITAL  SIGNS: ED Triage Vitals  Enc Vitals Group     BP 07/14/15 2100 128/76 mmHg     Pulse Rate 07/14/15 2100 103     Resp 07/14/15 2100 20     Temp 07/14/15 2120 98.5 F (36.9 C)     Temp Source 07/14/15 2120 Oral     SpO2 07/14/15 2051 94 %     Weight 07/14/15 2102 140 lb (63.504 kg)     Height 07/14/15 2102  (1.626 m)     Head Cir --      Peak Flow --      Pain Score 07/14/15 2102 0   Constitutional: Awake and alert. Oriented. Somewhat confused. Eyes: Conjunctivae are normal. PERRL. Normal extraocular movements. ENT   Head: Normocephalic and atraumatic.   Nose: No congestion/rhinnorhea.   Mouth/Throat: Mucous membranes are moist.   Neck: No stridor. Hematological/Lymphatic/Immunilogical: No cervical  lymphadenopathy. Cardiovascular: Tachycardic regular rhythm.  No murmurs, rubs, or gallops. Respiratory: Normal respiratory effort without tachypnea nor retractions. Breath sounds are clear and equal bilaterally. No wheezes/rales/rhonchi. Gastrointestinal: Soft and minimally tender to palpation in the lower abdomen. Genitourinary: Deferred Musculoskeletal: Normal range of motion in all extremities. No joint effusions.  No lower extremity tenderness nor edema. Neurologic:  Normal speech and language. No gross focal neurologic deficits are appreciated.  Skin:  Skin is warm, dry and intact. No rash noted. Psychiatric: Mood and affect are normal. Speech and behavior are normal. Patient exhibits appropriate insight and judgment.  ____________________________________________    LABS (pertinent positives/negatives) WBC 7.3 Trop <0.03 K 3.4 Cr 0.97 Lactic acid 2.7 Urine: WBC too numerous to count   ____________________________________________   EKG  I, Phineas Semen, attending physician, personally viewed and interpreted this EKG  EKG Time: 2044 Rate: 109 Rhythm: sinus tachycardia Axis: normal Intervals: qtc 427 QRS: narrow ST changes: no st elevation Impression: left atrial enlargement, borderline ekg   ____________________________________________    RADIOLOGY  CXR  IMPRESSION: Subsegmental atelectasis left lung base. Otherwise no acute process.  ____________________________________________   PROCEDURES  Procedure(s) performed: None  Critical Care performed: Yes  CRITICAL CARE Performed by: Phineas Semen   Total critical care time: 35 minutes  Critical care time was exclusive of separately billable procedures and treating other patients.  Critical care was necessary to treat or prevent imminent or life-threatening deterioration.  Critical care was time spent personally by me on the following activities: development of treatment plan with patient  and/or surrogate as well as nursing, discussions with consultants, evaluation of patient's response to treatment, examination of patient, obtaining history from patient or surrogate, ordering and performing treatments and interventions, ordering and review of laboratory studies, ordering and review of radiographic studies, pulse oximetry and re-evaluation of patient's condition.   ____________________________________________   INITIAL IMPRESSION / ASSESSMENT AND PLAN / ED COURSE  Pertinent labs & imaging results that were available during my care of the patient were reviewed by me and considered in my medical decision making (see chart for details).  Patient presented to the emergency department today because of concerns for confusion and difficulty with ambulation. Initial vital signs concerning for sepsis. Because of this patient was called a code sepsis. Patient was given multiple broad-spectrum antibiotics and fluids. As blood work returned it was noted she had an elevated lactate acid level. Additionally the patient had a urinalysis which was consistent with a UTI. Patient had Rocephin added on to the antibiotic regimen. Will plan on admission to the hospitalist service for further  workup and evaluation.  ____________________________________________   FINAL CLINICAL IMPRESSION(S) / ED DIAGNOSES  Final diagnoses:  Sepsis, due to unspecified organism [facility])  UTI (lower urinary tract infection)     Phineas Semen, MD 07/15/15 1437

## 2015-07-14 NOTE — H&P (Signed)
[facility] - [place] at [facility]   PATIENT NAME: Katherine Bartlett    MR#:  [id number]  DATE OF BIRTH:  [birth year]  DATE OF ADMISSION:  07/14/2015  PRIMARY CARE PHYSICIAN: No primary care provider on file.   REQUESTING/REFERRING PHYSICIAN: Dr. Derrill Kay  CHIEF COMPLAINT:   Chief Complaint  Patient presents with  . Fever    HISTORY OF PRESENT ILLNESS:  Hend Mccarrell  is a 82 y.o. female with a known history of COPD, chronic respiratory failure, dementia, hypertension presents from home brought in by daughter after she noticed patient was more confused and too weak to ambulate. Patient had a UTI 3 weeks back and was treated with antibiotic. Symptoms are similar and was brought to the emergency room. Here patient is confused. Has been found to be tachycardic. She was febrile at home but presently temperature is 99.6. Lactic acid elevated 2.7. Patient is on chronic oxygen.  PAST MEDICAL HISTORY:   Past Medical History  Diagnosis Date  . Dementia   . Osteoporosis   . Hypertension     PAST SURGICAL HISTORY:   Past Surgical History  Procedure Laterality Date  . Back surgery      SOCIAL HISTORY:   Social History  Substance Use Topics  . Smoking status: Never Smoker   . Smokeless tobacco: Not on file  . Alcohol Use: No    FAMILY HISTORY:  History reviewed. No pertinent family history. Could not obtain due to dementia. DRUG ALLERGIES:  No Known Allergies  REVIEW OF SYSTEMS:   Review of Systems  Unable to perform ROS: dementia    MEDICATIONS AT HOME:   Prior to Admission medications   Medication Sig Start Date End Date Taking? Authorizing Provider  albuterol (PROVENTIL HFA;VENTOLIN HFA) 108 (90 Base) MCG/ACT inhaler Inhale 2 puffs into the lungs every 6 (six) hours as needed for wheezing or shortness of breath.   Yes Historical Provider, MD  alendronate (FOSAMAX) 70 MG tablet Take 70 mg by mouth once a week. Take with a full glass of  water on an empty stomach.   Yes Historical Provider, MD  atenolol (TENORMIN) 25 MG tablet Take 25 mg by mouth daily.   Yes Historical Provider, MD  Calcium Carbonate-Vitamin D (CALCIUM 600+D) 600-400 MG-UNIT tablet Take 1 tablet by mouth 2 (two) times daily.   Yes Historical Provider, MD  Fluticasone-Salmeterol (ADVAIR) 250-50 MCG/DOSE AEPB Inhale 1 puff into the lungs 2 (two) times daily.   Yes Historical Provider, MD  galantamine (RAZADYNE) 8 MG tablet Take 8 mg by mouth 2 (two) times daily.   Yes Historical Provider, MD  tiotropium (SPIRIVA) 18 MCG inhalation capsule Place 18 mcg into inhaler and inhale daily.   Yes Historical Provider, MD  traMADol (ULTRAM) 50 MG tablet Take 1 tablet (50 mg total) by mouth every 6 (six) hours as needed. Patient not taking: Reported on 06/17/2015 03/02/15   Chinita Pester, FNP     VITAL SIGNS:  Blood pressure 114/58, pulse 102, temperature 98.5 F (36.9 C), temperature source Oral, resp. rate 26, height  (1.626 m), weight 63.504 kg (140 lb), SpO2 98 %.  PHYSICAL EXAMINATION:  Physical Exam  GENERAL:  82 y.o.-year-old patient lying in the bed . Confused EYES: Pupils equal, round, reactive to light and accommodation. No scleral icterus. Extraocular muscles intact.  HEENT: Head atraumatic, normocephalic. Oropharynx and nasopharynx clear. No oropharyngeal erythema, moist oral dry NECK:  Supple, no jugular venous distention. No thyroid enlargement, no tenderness.  LUNGS: Normal breath sounds bilaterally, no wheezing, rales, rhonchi. No use of accessory muscles of respiration.  CARDIOVASCULAR: S1, S2 normal. No murmurs, rubs, or gallops.  ABDOMEN: Soft, nontender, nondistended. Bowel sounds present. No organomegaly or mass.  EXTREMITIES: No pedal edema, cyanosis, or clubbing. + 2 pedal & radial pulses b/l.   NEUROLOGIC: Moves all 4 extremities symmetrically. Follows commands. PSYCHIATRIC: The patient is drowzy SKIN: No obvious rash, lesion, or ulcer.    LABORATORY PANEL:   CBC  Recent Labs Lab 07/14/15 2045  WBC 7.3  HGB 12.7  HCT 37.8  PLT 188   ------------------------------------------------------------------------------------------------------------------  Chemistries   Recent Labs Lab 07/14/15 2045  NA 143  K 3.4*  CL 111  CO2 24  GLUCOSE 181*  BUN 25*  CREATININE 0.97  CALCIUM 8.5*  AST 26  ALT 14  ALKPHOS 78  BILITOT 0.5   ------------------------------------------------------------------------------------------------------------------  Cardiac Enzymes  Recent Labs Lab 07/14/15 2045  TROPONINI <0.03   ------------------------------------------------------------------------------------------------------------------  RADIOLOGY:  Dg Chest Port 1 View  07/14/2015  CLINICAL DATA:  Fever today. Altered mental status. Diminished level of consciousness. EXAM: PORTABLE CHEST 1 VIEW COMPARISON:  Most recent chest imaging 09/10/2012 CT FINDINGS: The cardiomediastinal contours are normal. Minimal subsegmental left basilar atelectasis. Pulmonary vasculature is normal. No consolidation, pleural effusion, or pneumothorax. Cervical spine hardware is partially included. No acute osseous abnormalities are seen. Old left upper rib fracture. IMPRESSION: Subsegmental atelectasis left lung base. Otherwise no acute process. Electronically Signed   By: Rubye Oaks M.D.   On: 07/14/2015 21:00     IMPRESSION AND PLAN:   * UTI with tachycardia and elevated lactic acid and acute encephalopathy over dementia Start IV ceftriaxone. 2 L IV fluid bolus given. Repeat lactic acid. Wait for culture results sent from the emergency room.  * Hypertension Continue metoprolol  * COPD with chronic respiratory failure Continue home inhalers. When necessary nebulizer added.  * DVT prophylaxis with Lovenox  All the records are reviewed and case discussed with ED provider. Management plans discussed with the patient, family and  they are in agreement.  CODE STATUS: FULL  TOTAL TIME TAKING CARE OF THIS PATIENT: 40 minutes.   Milagros Loll R M.D on 07/14/2015 at 11:35 PM  Between 7am to 6pm - Pager - [phone]  After 6pm go to [email or web address] - password [email or web address] [facility]  [facility]  Office  [phone]  CC: Primary care physician; No primary care provider on file.  Note: This dictation was prepared with Dragon dictation along with smaller phrase technology. Any transcriptional errors that result from this process are unintentional.

## 2015-07-14 NOTE — ED Notes (Signed)
Pt arrived via EMS - Pt has fever and per daughter report to EMS decreased LOC today and not ambulating around house like usual - Had UTI 3 wks ago and was treated with atb - Pt has dx of dementia - Is on 3L of O2 via n/c

## 2015-07-14 NOTE — ED Notes (Signed)
Arrived via ems - c/o fever today and per ems daughter reported decreased loc and not able to walk around as usual - had UTI 3 wks ago and was treated

## 2015-07-15 LAB — BLOOD CULTURE ID PANEL (REFLEXED)
Acinetobacter baumannii: NOT DETECTED
CANDIDA ALBICANS: NOT DETECTED
CANDIDA KRUSEI: NOT DETECTED
CANDIDA PARAPSILOSIS: NOT DETECTED
Candida glabrata: NOT DETECTED
Candida tropicalis: NOT DETECTED
Carbapenem resistance: NOT DETECTED
ENTEROBACTER CLOACAE COMPLEX: NOT DETECTED
ENTEROBACTERIACEAE SPECIES: DETECTED — AB
Enterococcus species: NOT DETECTED
Escherichia coli: DETECTED — AB
Haemophilus influenzae: NOT DETECTED
KLEBSIELLA OXYTOCA: NOT DETECTED
KLEBSIELLA PNEUMONIAE: NOT DETECTED
Listeria monocytogenes: NOT DETECTED
METHICILLIN RESISTANCE: NOT DETECTED
Neisseria meningitidis: NOT DETECTED
PSEUDOMONAS AERUGINOSA: NOT DETECTED
Proteus species: NOT DETECTED
STREPTOCOCCUS AGALACTIAE: NOT DETECTED
STREPTOCOCCUS SPECIES: NOT DETECTED
Serratia marcescens: NOT DETECTED
Staphylococcus aureus (BCID): NOT DETECTED
Staphylococcus species: NOT DETECTED
Streptococcus pneumoniae: NOT DETECTED
Streptococcus pyogenes: NOT DETECTED
Vancomycin resistance: NOT DETECTED

## 2015-07-15 LAB — LACTIC ACID, PLASMA
LACTIC ACID, VENOUS: 3.4 mmol/L — AB (ref 0.5–2.0)
LACTIC ACID, VENOUS: 4.1 mmol/L — AB (ref 0.5–2.0)

## 2015-07-15 LAB — CBC
HCT: 33.5 % — ABNORMAL LOW (ref 35.0–47.0)
Hemoglobin: 11 g/dL — ABNORMAL LOW (ref 12.0–16.0)
MCH: 28.8 pg (ref 26.0–34.0)
MCHC: 32.8 g/dL (ref 32.0–36.0)
MCV: 87.9 fL (ref 80.0–100.0)
PLATELETS: 170 10*3/uL (ref 150–440)
RBC: 3.82 MIL/uL (ref 3.80–5.20)
RDW: 14.7 % — AB (ref 11.5–14.5)
WBC: 15.8 10*3/uL — ABNORMAL HIGH (ref 3.6–11.0)

## 2015-07-15 LAB — BASIC METABOLIC PANEL
Anion gap: 8 (ref 5–15)
BUN: 21 mg/dL — AB (ref 6–20)
CALCIUM: 7.5 mg/dL — AB (ref 8.9–10.3)
CHLORIDE: 115 mmol/L — AB (ref 101–111)
CO2: 21 mmol/L — AB (ref 22–32)
CREATININE: 1.07 mg/dL — AB (ref 0.44–1.00)
GFR calc non Af Amer: 47 mL/min — ABNORMAL LOW (ref >60)
GFR, EST AFRICAN AMERICAN: 54 mL/min — AB (ref >60)
GLUCOSE: 141 mg/dL — AB (ref 65–99)
Potassium: 3.9 mmol/L (ref 3.5–5.1)
Sodium: 144 mmol/L (ref 135–145)

## 2015-07-15 MED ORDER — SODIUM CHLORIDE 0.9 % IV SOLN
1.0000 g | Freq: Two times a day (BID) | INTRAVENOUS | Status: DC
  Administered 2015-07-15 – 2015-07-16 (×4): 1 g via INTRAVENOUS
  Filled 2015-07-15 (×6): qty 1

## 2015-07-15 MED ORDER — SODIUM CHLORIDE 0.9 % IV BOLUS (SEPSIS)
1000.0000 mL | Freq: Once | INTRAVENOUS | Status: AC
  Administered 2015-07-15: 1000 mL via INTRAVENOUS

## 2015-07-15 MED ORDER — GALANTAMINE HYDROBROMIDE 4 MG PO TABS
8.0000 mg | ORAL_TABLET | Freq: Two times a day (BID) | ORAL | Status: DC
  Administered 2015-07-15 – 2015-07-17 (×6): 8 mg via ORAL
  Filled 2015-07-15 (×7): qty 2

## 2015-07-15 MED ORDER — ATENOLOL 50 MG PO TABS
25.0000 mg | ORAL_TABLET | Freq: Every day | ORAL | Status: DC
Start: 2015-07-15 — End: 2015-07-18
  Administered 2015-07-15 – 2015-07-17 (×3): 25 mg via ORAL
  Filled 2015-07-15: qty 1
  Filled 2015-07-15: qty 2
  Filled 2015-07-15: qty 1
  Filled 2015-07-15: qty 0.5

## 2015-07-15 MED ORDER — SODIUM CHLORIDE 0.9 % IV SOLN
3.0000 g | Freq: Four times a day (QID) | INTRAVENOUS | Status: DC
  Administered 2015-07-15: 3 g via INTRAVENOUS
  Filled 2015-07-15 (×4): qty 3

## 2015-07-15 MED ORDER — MOMETASONE FURO-FORMOTEROL FUM 200-5 MCG/ACT IN AERO
2.0000 | INHALATION_SPRAY | Freq: Two times a day (BID) | RESPIRATORY_TRACT | Status: DC
  Administered 2015-07-15 – 2015-07-17 (×6): 2 via RESPIRATORY_TRACT
  Filled 2015-07-15: qty 8.8

## 2015-07-15 MED ORDER — TIOTROPIUM BROMIDE MONOHYDRATE 18 MCG IN CAPS
18.0000 ug | ORAL_CAPSULE | Freq: Every day | RESPIRATORY_TRACT | Status: DC
  Administered 2015-07-15 – 2015-07-17 (×3): 18 ug via RESPIRATORY_TRACT
  Filled 2015-07-15: qty 5

## 2015-07-15 NOTE — Progress Notes (Signed)
No change in review of systems, daughter at bedside has been updated by me and case Production designer, theatre/television/film. Pt has no signs of distress, has been up in the chair most of the day. VS wnl.

## 2015-07-15 NOTE — Progress Notes (Signed)
Clinical Child psychotherapist (CSW) received SNF consult. CSW asked MD to order PT. CSW will continue to follow and assist as needed.   Jetta Lout, LCSW [phone]

## 2015-07-15 NOTE — Progress Notes (Signed)
PT is recommending home health. Daughter Velna Hatchet [phone] is agreeable for patient to come back home with her. Please reconsult if future social work needs arise. CSW signing off.   Jetta Lout, LCSW [phone]

## 2015-07-15 NOTE — Progress Notes (Signed)
Pharmacy Antibiotic Note  Katherine Bartlett is a 82 y.o. female admitted on 07/14/2015 with UTI.  Pharmacy has been consulted for Ampicillin/sulbactam dosing.  Plan: Will order Ampicillin/sulbactam 3 g IV q6h.     Height:  (162.6 cm) Weight: 142 lb 1.6 oz (64.456 kg) IBW/kg (Calculated) : 54.7  Temp (24hrs), Avg:98.8 F (37.1 C), Min:98.5 F (36.9 C), Max:99.4 F (37.4 C)   Recent Labs Lab 07/14/15 2045 07/14/15 2343 07/15/15 0343  WBC 7.3  --  15.8*  CREATININE 0.97  --  1.07*  LATICACIDVEN 2.7* 4.1* 3.4*    Estimated Creatinine Clearance: 35 mL/min (by C-G formula based on Cr of 1.07).    No Known Allergies  Antimicrobials this admission: Anti-infectives    Start     Dose/Rate Route Frequency Ordered Stop   07/15/15 1800  cefTRIAXone (ROCEPHIN) 1 g in dextrose 5 % 50 mL IVPB  Status:  Discontinued     1 g 100 mL/hr over 30 Minutes Intravenous  Once 07/14/15 2330 07/15/15 0706   07/15/15 0745  Ampicillin-Sulbactam (UNASYN) 3 g in sodium chloride 0.9 % 100 mL IVPB     3 g 100 mL/hr over 60 Minutes Intravenous Every 6 hours 07/15/15 0744     07/14/15 2245  cefTRIAXone (ROCEPHIN) 1 g in dextrose 5 % 50 mL IVPB     1 g 100 mL/hr over 30 Minutes Intravenous  Once 07/14/15 2230 07/14/15 2331   07/14/15 2045  piperacillin-tazobactam (ZOSYN) IVPB 3.375 g     3.375 g 100 mL/hr over 30 Minutes Intravenous  Once 07/14/15 2042 07/14/15 2248   07/14/15 2045  vancomycin (VANCOCIN) IVPB 1000 mg/200 mL premix     1,000 mg 200 mL/hr over 60 Minutes Intravenous  Once 07/14/15 2042 07/14/15 2211      Microbiology results: Results for orders placed or performed during the hospital encounter of 06/17/15  Urine culture     Status: None   Collection Time: 06/17/15  3:21 PM  Result Value Ref Range Status   Specimen Description URINE, RANDOM  Final   Special Requests NONE  Final   Culture   Final    >=100,000 COLONIES/mL ESCHERICHIA COLI Results Called to: [name] AT  1222 06/19/15 DV    Report Status 06/19/2015 FINAL  Final   Organism ID, Bacteria ESCHERICHIA COLI  Final      Susceptibility   Escherichia coli - MIC*    AMPICILLIN >=32 RESISTANT Resistant     CEFAZOLIN >=64 RESISTANT Resistant     CEFTRIAXONE 32 RESISTANT Resistant     CIPROFLOXACIN >=4 RESISTANT Resistant     GENTAMICIN <=1 SENSITIVE Sensitive     IMIPENEM <=0.25 SENSITIVE Sensitive     NITROFURANTOIN <=16 SENSITIVE Sensitive     TRIMETH/SULFA >=320 RESISTANT Resistant     AMPICILLIN/SULBACTAM 4 SENSITIVE Sensitive     PIP/TAZO <=4 SENSITIVE Sensitive     * >=100,000 COLONIES/mL ESCHERICHIA COLI    Thank you for allowing pharmacy to be a part of this patient's care.  Katherine Bartlett G 07/15/2015 7:49 AM

## 2015-07-15 NOTE — Progress Notes (Signed)
Katherine Bartlett

## 2015-07-15 NOTE — Clinical Social Work Placement (Signed)
   CLINICAL SOCIAL WORK PLACEMENT  NOTE  Date:  07/15/2015  Patient Details  Name: Katherine Bartlett MRN: [id number] Date of Birth: May 02, 1931  Clinical Social Work is seeking post-discharge placement for this patient at the Skilled  Nursing Facility level of care (*CSW will initial, date and re-position this form in  chart as items are completed):  Yes   Patient/family provided with [facility] Clinical Social Work Department's list of facilities offering this level of care within the geographic area requested by the patient (or if unable, by the patient's family).  Yes   Patient/family informed of their freedom to choose among providers that offer the needed level of care, that participate in Medicare, Medicaid or managed care program needed by the patient, have an available bed and are willing to accept the patient.  Yes   Patient/family informed of [facility]'s ownership interest in [facility] and [facility] as well as of the fact that they are under no obligation to receive care at these facilities.  PASRR submitted to EDS on       PASRR number received on       Existing PASRR number confirmed on 07/15/15     FL2 transmitted to all facilities in geographic area requested by pt/family on 07/15/15     FL2 transmitted to all facilities within larger geographic area on       Patient informed that his/her managed care company has contracts with or will negotiate with certain facilities, including the following:            Patient/family informed of bed offers received.  Patient chooses bed at       Physician recommends and patient chooses bed at      Patient to be transferred to   on  .  Patient to be transferred to facility by       Patient family notified on   of transfer.  Name of family member notified:        PHYSICIAN       Additional Comment:    _______________________________________________ Haig Prophet, LCSW 07/15/2015, 1:23 PM

## 2015-07-15 NOTE — Progress Notes (Signed)
[facility] Physicians - [place] at [facility]   PATIENT NAME: Katherine Bartlett    MR#:  [id number]  DATE OF BIRTH:  31-Jan-1931  SUBJECTIVE:  Patient pleasantly demented   REVIEW OF SYSTEMS:    Review of Systems  Unable to perform ROS   Tolerating Diet:yes      DRUG ALLERGIES:  No Known Allergies  VITALS:  Blood pressure 102/63, pulse 85, temperature 99.4 F (37.4 C), temperature source Oral, resp. rate 16, height  (1.626 m), weight 64.456 kg (142 lb 1.6 oz), SpO2 100 %.  PHYSICAL EXAMINATION:   Physical Exam  Constitutional: She is well-developed, well-nourished, and in no distress. No distress.  HENT:  Head: Normocephalic and atraumatic.  Eyes: No scleral icterus.  Neck: Neck supple. No JVD present. No tracheal deviation present.  Cardiovascular: Normal rate, regular rhythm and normal heart sounds.  Exam reveals no gallop and no friction rub.   No murmur heard. Pulmonary/Chest: Effort normal and breath sounds normal. No respiratory distress. She has no wheezes. She has no rales. She exhibits no tenderness.  Abdominal: Soft. Bowel sounds are normal. She exhibits no distension and no mass. There is no tenderness. There is no rebound and no guarding.  Musculoskeletal: She exhibits no edema.  Neurological: She is alert.  Skin: Skin is warm. No rash noted. No erythema.      LABORATORY PANEL:   CBC  Recent Labs Lab 07/15/15 0343  WBC 15.8*  HGB 11.0*  HCT 33.5*  PLT 170   ------------------------------------------------------------------------------------------------------------------  Chemistries   Recent Labs Lab 07/14/15 2045 07/15/15 0343  NA 143 144  K 3.4* 3.9  CL 111 115*  CO2 24 21*  GLUCOSE 181* 141*  BUN 25* 21*  CREATININE 0.97 1.07*  CALCIUM 8.5* 7.5*  AST 26  --   ALT 14  --   ALKPHOS 78  --   BILITOT 0.5  --     ------------------------------------------------------------------------------------------------------------------  Cardiac Enzymes  Recent Labs Lab 07/14/15 2045  TROPONINI <0.03   ------------------------------------------------------------------------------------------------------------------  RADIOLOGY:  Dg Chest Port 1 View  07/14/2015  CLINICAL DATA:  Fever today. Altered mental status. Diminished level of consciousness. EXAM: PORTABLE CHEST 1 VIEW COMPARISON:  Most recent chest imaging 09/10/2012 CT FINDINGS: The cardiomediastinal contours are normal. Minimal subsegmental left basilar atelectasis. Pulmonary vasculature is normal. No consolidation, pleural effusion, or pneumothorax. Cervical spine hardware is partially included. No acute osseous abnormalities are seen. Old left upper rib fracture. IMPRESSION: Subsegmental atelectasis left lung base. Otherwise no acute process. Electronically Signed   By: Rubye Oaks M.D.   On: 07/14/2015 21:00     ASSESSMENT AND PLAN:    82 year old female with dementia who presents with acute encephalopathy  1. Acute encephalopathy: This is due to bacteremia with urinary tract infection. Blood cultures are positive for Enterobacter and Escherichia coli. About exchange meropenem. ID consult. Patient needs repeat blood cultures in 2 days to assure clearing. Follow-up on final results of blood culture sensitivities.  2. Essential hypertension: Continue atenolol.  3. Hypokalemia: Potassium improved.  4. Sepsis, present on admission: Patient with elevated lactic acid, and low-grade fever and elevated white blood cell count This is due to bacteremia as mentioned above. Continue antibiotics     CODE STATUS: FULL  TOTAL TIME TAKING CARE OF THIS PATIENT: 30 minutes.     POSSIBLE D/C 2-3 days, DEPENDING ON CLINICAL CONDITION.   Deshonna Trnka M.D on 07/15/2015 at 10:52 AM  Between 7am to 6pm - Pager -  [phone] After 6pm go to  [email or web address] - password [email or web address]  [facility] [place] Hospital[facility]  Office  [phone]  CC: Primary care physician; No primary care provider on file.  Note: This dictation was prepared with Dragon dictation along with smaller phrase technology. Any transcriptional errors that result from this process are unintentional.

## 2015-07-15 NOTE — Evaluation (Signed)
Physical Therapy Evaluation Patient Details Name: Katherine Bartlett MRN: [id number] DOB: 1931-02-22 Today's Date: 07/15/2015   History of Present Illness  Pt admitted for UTI. Pt with complaints of fever, confusion, and inability to ambulate. Pt with history of COPD, CRF, dementia, and HTN. Pt currently on chronic 3L of O2.  Clinical Impression  Pt is a pleasant 82 year old female who was admitted for UTI. Pt performs bed mobility with mod I and transfers/ambulation with cga and rw. Pt demonstrates deficits with strength/endurance. O2 sats remained at 98% pre/post exertion with no SOB symptoms noted. Pt complains that R LE feels fatigued with exertion. R LE is slightly weaker compared to L LE. Pt reports she is close to baseline level as she is a household ambulator with rw at baseline. Would benefit from skilled PT to address above deficits and promote optimal return to PLOF. Recommend transition to HHPT upon discharge from acute hospitalization.       Follow Up Recommendations Home health PT;Supervision for mobility/OOB    Equipment Recommendations       Recommendations for Other Services       Precautions / Restrictions Precautions Precautions: Fall Restrictions Weight Bearing Restrictions: No      Mobility  Bed Mobility Overal bed mobility: Modified Independent             General bed mobility comments: safe technique performed with bed mobility. Once seated at EOB, pt able to sit with supervision. Pt able to scoot towards EOB with cues  Transfers Overall transfer level: Needs assistance Equipment used: Rolling walker (2 wheeled) Transfers: Sit to/from Stand Sit to Stand: Min guard         General transfer comment: safe technique performed with rw. Cues for correct hand placement required. O2 donned for all mobility. Once standing, no LOB noted.  Ambulation/Gait Ambulation/Gait assistance: Min guard Ambulation Distance (Feet): 20 Feet Assistive device:  Rolling walker (2 wheeled) Gait Pattern/deviations: Step-to pattern     General Gait Details: ambulated using rw and safe technique. Slow gait speed noted. Pt refuses further ambulation as her R LE fatigues with increased distance. O2 sats at 98% post ambulation while on 3L of O2.  Stairs            Wheelchair Mobility    Modified Rankin (Stroke Patients Only)       Balance Overall balance assessment: History of Falls;Needs assistance Sitting-balance support: Feet supported Sitting balance-Leahy Scale: Normal     Standing balance support: Bilateral upper extremity supported Standing balance-Leahy Scale: Good                               Pertinent Vitals/Pain Pain Assessment: No/denies pain    Home Living Family/patient expects to be discharged to:: Private residence Living Arrangements: Children (daughter is there 24/7 per patient report) Available Help at Discharge: Family;Available 24 hours/day Type of Home: House Home Access: Stairs to enter Entrance Stairs-Rails:  (1 railing) Entrance Stairs-Number of Steps: 3 Home Layout: One level Home Equipment: Walker - 2 wheels      Prior Function Level of Independence: Independent with assistive device(s)         Comments: ambulated using rw at all time     Hand Dominance        Extremity/Trunk Assessment   Upper Extremity Assessment: Overall WFL for tasks assessed           Lower Extremity Assessment: Generalized weakness (  grossly 4/5; R LE fatigues quickly with exertion)         Communication   Communication: No difficulties  Cognition Arousal/Alertness: Awake/alert Behavior During Therapy: WFL for tasks assessed/performed Overall Cognitive Status: Within Functional Limits for tasks assessed                      General Comments      Exercises Other Exercises Other Exercises: Seated ther-ex performed including B LE hip add squeezes, hip abd/add, SAQ, and LAQ. All  ther-ex performed with cga and cues for correct technique.      Assessment/Plan    PT Assessment Patient needs continued PT services  PT Diagnosis Difficulty walking;Generalized weakness   PT Problem List Decreased strength;Decreased activity tolerance;Decreased balance;Decreased mobility  PT Treatment Interventions DME instruction;Therapeutic exercise;Gait training   PT Goals (Current goals can be found in the Care Plan section) Acute Rehab PT Goals Patient Stated Goal: to go home PT Goal Formulation: With patient Time For Goal Achievement: 07/29/15 Potential to Achieve Goals: Good    Frequency Min 2X/week   Barriers to discharge        Co-evaluation               End of Session Equipment Utilized During Treatment: Gait belt;Oxygen Activity Tolerance: Patient tolerated treatment well Patient left: in chair;with chair alarm set Nurse Communication: Mobility status         Time: 1345-1406 PT Time Calculation (min) (ACUTE ONLY): 21 min   Charges:   PT Evaluation $PT Eval Moderate Complexity: 1 Procedure PT Treatments $Therapeutic Exercise: 8-22 mins   PT G Codes:        Engelbert Sevin 07/27/15, 3:52 PM  Elizabeth Palau, PT, DPT [phone]

## 2015-07-15 NOTE — NC FL2 (Signed)
[place] MEDICAID FL2 LEVEL OF CARE SCREENING TOOL     IDENTIFICATION  Patient Name: Katherine Bartlett Birthdate: [birth year] Sex: female Admission Date (Current Location): 07/14/2015  [place] and [place] Number:  [address]  ([id number]) Facility and Address:  [facility], [address] [place], [place] [address]      Provider Number: [id number]  Attending Physician Name and Address:  Adrian Saran, MD  Relative Name and Phone Number:       Current Level of Care: Hospital Recommended Level of Care: Skilled Nursing Facility Prior Approval Number:    Date Approved/Denied:   PASRR Number:  ( [id number] )  Discharge Plan: SNF    Current Diagnoses: Patient Active Problem List   Diagnosis Date Noted  . UTI (lower urinary tract infection) 07/14/2015  . Fracture of cervical vertebra (HCC) 09/11/2012  . Chronic obstructive pulmonary disease (HCC) 09/11/2012  . BP (high blood pressure) 09/11/2012    Orientation RESPIRATION BLADDER Height & Weight     Self  O2 (3 Liters Oxygen ) Incontinent Weight: 142 lb 1.6 oz (64.456 kg) Height:   (162.6 cm)  BEHAVIORAL SYMPTOMS/MOOD NEUROLOGICAL BOWEL NUTRITION STATUS   (none )  (none ) Continent Diet (Diet: 2 Grams Sodium. )  AMBULATORY STATUS COMMUNICATION OF NEEDS Skin   Extensive Assist Verbally Normal                       Personal Care Assistance Level of Assistance  Bathing, Feeding, Dressing Bathing Assistance: Limited assistance Feeding assistance: Independent Dressing Assistance: Limited assistance     Functional Limitations Info  Sight, Hearing, Speech Sight Info: Adequate Hearing Info: Adequate Speech Info: Adequate    SPECIAL CARE FACTORS FREQUENCY  PT (By licensed PT)     PT Frequency:  (5)              Contractures      Additional Factors Info  Code Status Code Status Info:  (Full Code. )             Current Medications (07/15/2015):  This is the current  hospital active medication list Current Facility-Administered Medications  Medication Dose Route Frequency Provider Last Rate Last Dose  . acetaminophen (TYLENOL) tablet 650 mg  650 mg Oral Q6H PRN Milagros Loll, MD       Or  . acetaminophen (TYLENOL) suppository 650 mg  650 mg Rectal Q6H PRN Srikar Sudini, MD      . atenolol (TENORMIN) tablet 25 mg  25 mg Oral Daily Milagros Loll, MD   25 mg at 07/15/15 0904  . bisacodyl (DULCOLAX) EC tablet 5 mg  5 mg Oral Daily PRN Srikar Sudini, MD      . enoxaparin (LOVENOX) injection 40 mg  40 mg Subcutaneous Q24H Srikar Sudini, MD      . galantamine (RAZADYNE) tablet 8 mg  8 mg Oral BID Milagros Loll, MD   8 mg at 07/15/15 0903  . ipratropium-albuterol (DUONEB) 0.5-2.5 (3) MG/3ML nebulizer solution 3 mL  3 mL Nebulization Q4H PRN Srikar Sudini, MD      . meropenem (MERREM) 1 g in sodium chloride 0.9 % 100 mL IVPB  1 g Intravenous Q12H Sheema M Hallaji, RPH   1 g at 07/15/15 1206  . mometasone-formoterol (DULERA) 200-5 MCG/ACT inhaler 2 puff  2 puff Inhalation BID Milagros Loll, MD   2 puff at 07/15/15 0905  . ondansetron (ZOFRAN) tablet 4 mg  4 mg Oral Q6H PRN Srikar  Sudini, MD       Or  . ondansetron (ZOFRAN) injection 4 mg  4 mg Intravenous Q6H PRN Srikar Sudini, MD      . polyethylene glycol (MIRALAX / GLYCOLAX) packet 17 g  17 g Oral Daily PRN Srikar Sudini, MD      . tiotropium (SPIRIVA) inhalation capsule 18 mcg  18 mcg Inhalation Daily Milagros Loll, MD   18 mcg at 07/15/15 0906     Discharge Medications: Please see discharge summary for a list of discharge medications.  Relevant Imaging Results:  Relevant Lab Results:   Additional Information  (SSN: [id number])  Haig Prophet, LCSW

## 2015-07-15 NOTE — Care Management Important Message (Signed)
Important Message  Patient Details  Name: Katherine Bartlett MRN: [id number] Date of Birth: Apr 27, 1931   Medicare Important Message Given:  Yes    Jonia Oakey A, RN 07/15/2015, 7:22 AM

## 2015-07-15 NOTE — Clinical Social Work Note (Signed)
Clinical Social Work Assessment  Patient Details  Name: Katherine Bartlett MRN: [id number] Date of Birth: 1930/07/23  Date of referral:  07/15/15               Reason for consult:  Facility Placement                Permission sought to share information with:  Oceanographer granted to share information::  Yes, Verbal Permission Granted  Name::      Skilled Nursing Facility   Agency::   [place]   Relationship::     Contact Information:     Housing/Transportation Living arrangements for the past 2 months:  Single Family Home Source of Information:  Adult Children Patient Interpreter Needed:  None Criminal Activity/Legal Involvement Pertinent to Current Situation/Hospitalization:  No - Comment as needed Significant Relationships:  Adult Children Lives with:  Adult Children Do you feel safe going back to the place where you live?  Yes Need for family participation in patient care:  Yes (Comment)  Care giving concerns:  Patient lives with her daughter Katherine Bartlett in [place].    Social Worker assessment / plan:  Visual merchandiser (CSW) received SNF consult. PT is pending. CSW attempted to meet with patient however she was asleep. CSW contacted patient's daughter Katherine Bartlett. Per daughter patient lives with her in [place]. Daughter reported that they have been living together for several years now. Per daughter she is HPOA and provides 24/7 supervision for patient. Daughter reported that she has 2 brothers that live far away and provide limited support. Per daughter patient can walk with a walker. CSW explained that PT is going to work with patient at [facility] and make a recommendation of home health or SNF. Per daughter patient went to rehab at [facility] 2 years ago. Daughter is agreeable to SNF search and prefers [facility]. CSW explained that patient will need a 3 night inpatient qualifying stay in order for Medicare to pay for SNF. Daughter  verbalized her understanding.   FL2 complete and faxed out.   Employment status:  Retired Health and safety inspector:  Medicare PT Recommendations:  Not assessed at this time Information / Referral to community resources:  Skilled Nursing Facility  Patient/Family's Response to care:  Patient's daughter is agreeable to [facility].   Patient/Family's Understanding of and Emotional Response to Diagnosis, Current Treatment, and Prognosis: Daughter was pleasant and thanked CSW for visit.   Emotional Assessment Appearance:    Attitude/Demeanor/Rapport:  Unable to Assess Affect (typically observed):  Unable to Assess Orientation:  Oriented to Self, Fluctuating Orientation (Suspected and/or reported Sundowners) Alcohol / Substance use:  Not Applicable Psych involvement (Current and /or in the community):  No (Comment)  Discharge Needs  Concerns to be addressed:  Discharge Planning Concerns Readmission within the last 30 days:  No Current discharge risk:  Chronically ill, Cognitively Impaired Barriers to Discharge:  Continued Medical Work up   Haig Prophet, LCSW 07/15/2015, 1:24 PM

## 2015-07-15 NOTE — Progress Notes (Signed)
ANTIBIOTIC CONSULT NOTE - INITIAL  Pharmacy Consult for Meropenem  Indication: bacteremia  No Known Allergies  Patient Measurements: Height:  (162.6 cm) Weight: 142 lb 1.6 oz (64.456 kg) IBW/kg (Calculated) : 54.7  Vital Signs: Temp: 99.4 F (37.4 C) (02/24 0742) Temp Source: Oral (02/24 0742) BP: 102/63 mmHg (02/24 0742) Pulse Rate: 85 (02/24 0742) Intake/Output from previous day: 02/23 0701 - 02/24 0700 In: 1000 [IV Piggyback:1000] Out: -   Recent Labs  07/14/15 2045 07/15/15 0343  WBC 7.3 15.8*  HGB 12.7 11.0*  PLT 188 170  CREATININE 0.97 1.07*   Estimated Creatinine Clearance: 35 mL/min (by C-G formula based on Cr of 1.07). No results for input(s): VANCOTROUGH, VANCOPEAK, VANCORANDOM, GENTTROUGH, GENTPEAK, GENTRANDOM, TOBRATROUGH, TOBRAPEAK, TOBRARND, AMIKACINPEAK, AMIKACINTROU, AMIKACIN in the last 72 hours.   Microbiology: Recent Results (from the past 720 hour(s))  Urine culture     Status: None   Collection Time: 06/17/15  3:21 PM  Result Value Ref Range Status   Specimen Description URINE, RANDOM  Final   Special Requests NONE  Final   Culture   Final    >=100,000 COLONIES/mL ESCHERICHIA COLI Results Called to: [name] AT 1222 06/19/15 DV    Report Status 06/19/2015 FINAL  Final   Organism ID, Bacteria ESCHERICHIA COLI  Final      Susceptibility   Escherichia coli - MIC*    AMPICILLIN >=32 RESISTANT Resistant     CEFAZOLIN >=64 RESISTANT Resistant     CEFTRIAXONE 32 RESISTANT Resistant     CIPROFLOXACIN >=4 RESISTANT Resistant     GENTAMICIN <=1 SENSITIVE Sensitive     IMIPENEM <=0.25 SENSITIVE Sensitive     NITROFURANTOIN <=16 SENSITIVE Sensitive     TRIMETH/SULFA >=320 RESISTANT Resistant     AMPICILLIN/SULBACTAM 4 SENSITIVE Sensitive     PIP/TAZO <=4 SENSITIVE Sensitive     * >=100,000 COLONIES/mL ESCHERICHIA COLI  Blood Culture (routine x 2)     Status: None (Preliminary result)   Collection Time: 07/14/15  8:42 PM  Result Value  Ref Range Status   Specimen Description BLOOD LEFT WRIST  Final   Special Requests BOTTLES DRAWN AEROBIC AND ANAEROBIC 1CCANA,1AERO  Final   Culture  Setup Time   Final    GRAM NEGATIVE RODS IN BOTH AEROBIC AND ANAEROBIC BOTTLES Organism ID to follow CRITICAL RESULT CALLED TO, READ BACK BY AND VERIFIED WITH: Roque Cash 07/15/15 1027AM MLM    Culture   Final    ESCHERICHIA COLI IN BOTH AEROBIC AND ANAEROBIC BOTTLES SUSCEPTIBILITIES TO FOLLOW    Report Status PENDING  Incomplete  Blood Culture (routine x 2)     Status: None (Preliminary result)   Collection Time: 07/14/15  9:10 PM  Result Value Ref Range Status   Specimen Description BLOOD RIGHT HAND  Final   Special Requests BOTTLES DRAWN AEROBIC AND ANAEROBIC 2CCAERO,4CCANA  Final   Culture NO GROWTH < 12 HOURS  Final   Report Status PENDING  Incomplete    Medical History: Past Medical History  Diagnosis Date  . Dementia   . Osteoporosis   . Hypertension    Assessment: 82 yo patient admitted on 07/14/15 with UTI. Biofire results were called to pharmacy on 2/24 :25 for BCID 2/2 E.Coli. Patient was started on Unasyn this morning based on culture results from January from UTI Dx in ED.    Plan:  After speaking with Dr. Juliene Pina, she wishes to start patient on meropenem now that patient is positive for bacteremia, with  plans to narrow as soon as we have susceptibilities.  Will start the patient on meropenem 1g every 12 hours IV due to CrCl <52ml/min. Pharmacy will continue to monitor renal function and follow up on cultures and sensitivities.   Cher Nakai, PharmD Pharmacy Resident  07/15/2015,10:33 AM

## 2015-07-16 NOTE — Progress Notes (Signed)
New dressing applied to right lower leg. Pt had a large blister to it

## 2015-07-16 NOTE — Progress Notes (Signed)
Alert but confused and cooperative. Had some low grade fever during the night. Tylenol given for pain during the night. O2 at 2 L. Iv antibiotics given.

## 2015-07-16 NOTE — Progress Notes (Signed)
[facility] [place] at [facility]   PATIENT NAME: Katherine Bartlett    MR#:  [id number]  DATE OF BIRTH:  June 04, 1930  SUBJECTIVE:  Patient pleasantly demented No acute events overnight   REVIEW OF SYSTEMS:    Review of Systems  Unable to perform ROS   Tolerating Diet:yes      DRUG ALLERGIES:  No Known Allergies  VITALS:  Blood pressure 133/67, pulse 79, temperature 98.4 F (36.9 C), temperature source Oral, resp. rate 16, height  (1.626 m), weight 63.912 kg (140 lb 14.4 oz), SpO2 96 %.  PHYSICAL EXAMINATION:   Physical Exam  Constitutional: She is well-developed, well-nourished, and in no distress. No distress.  HENT:  Head: Normocephalic and atraumatic.  Eyes: No scleral icterus.  Neck: Neck supple. No JVD present. No tracheal deviation present.  Cardiovascular: Normal rate, regular rhythm and normal heart sounds.  Exam reveals no gallop and no friction rub.   No murmur heard. Pulmonary/Chest: Effort normal and breath sounds normal. No respiratory distress. She has no wheezes. She has no rales. She exhibits no tenderness.  Abdominal: Soft. Bowel sounds are normal. She exhibits no distension and no mass. There is no tenderness. There is no rebound and no guarding.  Musculoskeletal: She exhibits no edema.  Neurological: She is alert.  Skin: Skin is warm. No rash noted. No erythema.      LABORATORY PANEL:   CBC  Recent Labs Lab 07/15/15 0343  WBC 15.8*  HGB 11.0*  HCT 33.5*  PLT 170   ------------------------------------------------------------------------------------------------------------------  Chemistries   Recent Labs Lab 07/14/15 2045 07/15/15 0343  NA 143 144  K 3.4* 3.9  CL 111 115*  CO2 24 21*  GLUCOSE 181* 141*  BUN 25* 21*  CREATININE 0.97 1.07*  CALCIUM 8.5* 7.5*  AST 26  --   ALT 14  --   ALKPHOS 78  --   BILITOT 0.5  --     ------------------------------------------------------------------------------------------------------------------  Cardiac Enzymes  Recent Labs Lab 07/14/15 2045  TROPONINI <0.03   ------------------------------------------------------------------------------------------------------------------  RADIOLOGY:  Dg Chest Port 1 View  07/14/2015  CLINICAL DATA:  Fever today. Altered mental status. Diminished level of consciousness. EXAM: PORTABLE CHEST 1 VIEW COMPARISON:  Most recent chest imaging 09/10/2012 CT FINDINGS: The cardiomediastinal contours are normal. Minimal subsegmental left basilar atelectasis. Pulmonary vasculature is normal. No consolidation, pleural effusion, or pneumothorax. Cervical spine hardware is partially included. No acute osseous abnormalities are seen. Old left upper rib fracture. IMPRESSION: Subsegmental atelectasis left lung base. Otherwise no acute process. Electronically Signed   By: Rubye Oaks M.D.   On: 07/14/2015 21:00     ASSESSMENT AND PLAN:    82 year old female with dementia who presents with acute encephalopathy  1. Acute encephalopathy: This is due to Escherichia coli bacteremia with urinary tract infection. Blood cultures are positive for Escherichia coli. Continue Meropenem until sensitivities back. Urine cultures also showing 100,000 gram-negative rods this is likely Escherichia coli.  ID consult. I will repeat blood cultures tomorrow.  Follow-up on final results of blood culture sensitivities.  2. Essential hypertension: Continue atenolol.  3. Hypokalemia: Potassium improved.  4. Sepsis, present on admission: Patient with elevated lactic acid, and low-grade fever and elevated white blood cell count This is due to Escherichia coli bacteremia and urinary tract infection as mentioned above. Continue antibiotics     CODE STATUS: FULL  T[facility] Center[facility]: 25 minutes.    Physical therapy is recommending home  health at discharge POSSIBLE D/C 2-3 days, DEPENDING ON CLINICAL CONDITION.   Tyris Eliot M.D on 07/16/2015 at 10:03 AM  Between 7am to 6pm - Pager - [phone] After 6pm go to [email or web address] - password [email or web address] [facility]  [place] [facility]  Office  [phone]  CC: Primary care physician; No primary care provider on file.  Note: This dictation was prepared with Dragon dictation along with smaller phrase technology. Any transcriptional errors that result from this process are unintentional.

## 2015-07-17 ENCOUNTER — Encounter: Payer: Self-pay | Admitting: Internal Medicine

## 2015-07-17 LAB — CULTURE, BLOOD (ROUTINE X 2)

## 2015-07-17 LAB — BASIC METABOLIC PANEL
ANION GAP: 6 (ref 5–15)
BUN: 26 mg/dL — ABNORMAL HIGH (ref 6–20)
CHLORIDE: 114 mmol/L — AB (ref 101–111)
CO2: 23 mmol/L (ref 22–32)
Calcium: 8 mg/dL — ABNORMAL LOW (ref 8.9–10.3)
Creatinine, Ser: 0.79 mg/dL (ref 0.44–1.00)
Glucose, Bld: 126 mg/dL — ABNORMAL HIGH (ref 65–99)
POTASSIUM: 3.9 mmol/L (ref 3.5–5.1)
SODIUM: 143 mmol/L (ref 135–145)

## 2015-07-17 LAB — URINE CULTURE

## 2015-07-17 MED ORDER — CIPROFLOXACIN HCL 500 MG PO TABS: 500.0000 mg | ORAL_TABLET | Freq: Two times a day (BID) | ORAL | Status: DC

## 2015-07-17 MED ORDER — CIPROFLOXACIN IN D5W 400 MG/200ML IV SOLN
400.0000 mg | Freq: Two times a day (BID) | INTRAVENOUS | Status: DC
  Filled 2015-07-17 (×3): qty 200

## 2015-07-17 NOTE — Plan of Care (Signed)
Problem: Education: Goal: Knowledge of [facility] Education information/materials will improve Outcome: Not Progressing Patient has dementia     

## 2015-07-17 NOTE — Care Management Note (Addendum)
Case Management Note  Patient Details  Name: LYNZIE CLIBURN MRN: [id number] Date of Birth: 1931/05/06  Subjective/Objective:   Discussed discharge planning with daughter Silvio Pate. Explained that Mrs Winkowski would be discharged home today with new oxygen and new home health RN and PT. Daughter chose [facility] to be her Mothers home health provider. A referral was faxed and called to [facility] requesting a portable oxygen tank be delivered to Mrs [facility] hospital room and a new home oxygen setup for her home. Also requested home health PT and RN services from [facility]. Daughter expressed some concerns about how much longer she would be able to manage Mrs Jffreys at home. This Clinical research associate suggested that she discuss her concerns with Mrs Luster PCP and with the [facility] RN who can initiate placement if daughter decides that Mrs Juul needs placement.    Action/Plan:   Expected Discharge Date:                  Expected Discharge Plan:     In-House Referral:     Discharge planning Services     Post Acute Care Choice:    Choice offered to:     DME Arranged:    DME Agency:     HH Arranged:    HH Agency:     Status of Service:     Medicare Important Message Given:  Yes Date Medicare IM Given:    Medicare IM give by:    Date Additional Medicare IM Given:    Additional Medicare Important Message give by:     If discussed at Long Length of Stay Meetings, dates discussed:    Additional Comments:  Lakira Ogando A, RN 07/17/2015, 12:53 PM

## 2015-07-17 NOTE — Discharge Summary (Signed)
[facility] - [place] at [facility]   PATIENT NAME: Katherine Bartlett    MR#:  [id number]  DATE OF BIRTH:  02-Nov-1930  DATE OF ADMISSION:  07/14/2015 ADMITTING PHYSICIAN: Milagros Loll, MD  DATE OF DISCHARGE: 07/17/2015  PRIMARY CARE PHYSICIAN: No primary care provider on file.    ADMISSION DIAGNOSIS:  UTI (lower urinary tract infection) [N39.0] Sepsis, due to unspecified organism (HCC) [A41.9]  DISCHARGE DIAGNOSIS:  Active Problems:   UTI (lower urinary tract infection)   SECONDARY DIAGNOSIS:   Past Medical History  Diagnosis Date  . Dementia   . Osteoporosis   . Hypertension     HOSPITAL COURSE:   82 year old female with dementia who presents with acute encephalopathy  1. Acute encephalopathy: This is due to Escherichia coli bacteremia with urinary tract infection. Blood cultures are positive for Escherichia coli. We Stopped meropenem and started ciprofloxacin as per sensitivities.  Patient will require 2 weeks of antibiotics. Bacteremias from urinary tract infection. Urine culture was also positive for Escherichia coli.  2. Essential hypertension: Continue atenolol.  3. Hypokalemia: Potassium improved.  4. Sepsis, present on admission: Patient with elevated lactic acid, and low-grade fever and elevated white blood cell count This is due to Escherichia coli bacteremia and urinary tract infection as mentioned above. Continue antibiotics   DISCHARGE CONDITIONS AND DIET:    Stable for discharge on a heart healthy diet  CONSULTS OBTAINED:     DRUG ALLERGIES:  No Known Allergies  DISCHARGE MEDICATIONS:   Current Discharge Medication List    START taking these medications   Details  ciprofloxacin (CIPRO) 500 MG tablet Take 1 tablet (500 mg total) by mouth 2 (two) times daily. Qty: 21 tablet, Refills: 0      CONTINUE these medications which have NOT CHANGED   Details  albuterol (PROVENTIL HFA;VENTOLIN HFA) 108 (90 Base) MCG/ACT  inhaler Inhale 2 puffs into the lungs every 6 (six) hours as needed for wheezing or shortness of breath.    alendronate (FOSAMAX) 70 MG tablet Take 70 mg by mouth once a week. Take with a full glass of water on an empty stomach.    atenolol (TENORMIN) 25 MG tablet Take 25 mg by mouth daily.    Calcium Carbonate-Vitamin D (CALCIUM 600+D) 600-400 MG-UNIT tablet Take 1 tablet by mouth 2 (two) times daily.    Fluticasone-Salmeterol (ADVAIR) 250-50 MCG/DOSE AEPB Inhale 1 puff into the lungs 2 (two) times daily.    galantamine (RAZADYNE) 8 MG tablet Take 8 mg by mouth 2 (two) times daily.    tiotropium (SPIRIVA) 18 MCG inhalation capsule Place 18 mcg into inhaler and inhale daily.      STOP taking these medications     traMADol (ULTRAM) 50 MG tablet               Today   CHIEF COMPLAINT:  Patient pleasantly demented no acute issues overnight   VITAL SIGNS:  Blood pressure 143/78, pulse 70, temperature 98.2 F (36.8 C), temperature source Oral, resp. rate 16, height  (1.626 m), weight 63.685 kg (140 lb 6.4 oz), SpO2 98 %.   REVIEW OF SYSTEMS:  Review of Systems  Unable to perform ROS    PHYSICAL EXAMINATION:  GENERAL:  82 y.o.-year-old patient lying in the bed with no acute distress.  NECK:  Supple, no jugular venous distention. No thyroid enlargement, no tenderness.  LUNGS: Normal breath sounds bilaterally, no wheezing, rales,rhonchi  No use of accessory muscles of respiration.  CARDIOVASCULAR: S1,  S2 normal. No murmurs, rubs, or gallops.  ABDOMEN: Soft, non-tender, non-distended. Bowel sounds present. No organomegaly or mass.  EXTREMITIES: No pedal edema, cyanosis, or clubbing.  PSYCHIATRIC: The patient is alert and oriented x name SKIN: No obvious rash, lesion, or ulcer.   DATA REVIEW:   CBC  Recent Labs Lab 07/15/15 0343  WBC 15.8*  HGB 11.0*  HCT 33.5*  PLT 170    Chemistries   Recent Labs Lab 07/14/15 2045  07/17/15 0043  NA 143  < > 143   K 3.4*  < > 3.9  CL 111  < > 114*  CO2 24  < > 23  GLUCOSE 181*  < > 126*  BUN 25*  < > 26*  CREATININE 0.97  < > 0.79  CALCIUM 8.5*  < > 8.0*  AST 26  --   --   ALT 14  --   --   ALKPHOS 78  --   --   BILITOT 0.5  --   --   < > = values in this interval not displayed.  Cardiac Enzymes  Recent Labs Lab 07/14/15 2045  TROPONINI <0.03    Microbiology Results  @  RADIOLOGY:  No results found.   Patient should follow up with PCP in one week Stable for discharge  Left message with daughter CODE STATUS:     Code Status Orders        Start     Ordered   07/14/15 2335  Full code   Continuous     07/14/15 2335    Code Status History    Date Active Date Inactive Code Status Order ID Comments User Context   This patient has a current code status but no historical code status.      TOTAL TIME TAKING CARE OF THIS PATIENT: 35 minutes.    Note: This dictation was prepared with Dragon dictation along with smaller phrase technology. Any transcriptional errors that result from this process are unintentional.  Oveta Idris M.D on 07/17/2015 at 9:50 AM  Between 7am to 6pm - Pager - [phone] After 6pm go to [email or web address] - password [email or web address] [facility]  [place] [facility]  Office  [phone]  CC: Primary care physician; No primary care provider on file.

## 2015-07-17 NOTE — Progress Notes (Signed)
[facility] - [place] at [facility]   PATIENT NAME: Katherine Bartlett    MR#:  [id number]  DATE OF BIRTH:  January 07, 1931  SUBJECTIVE:  Patient pleasantly demented No acute events overnight   REVIEW OF SYSTEMS:    Review of Systems  Unable to perform ROS   Tolerating Diet:yes      DRUG ALLERGIES:  No Known Allergies  VITALS:  Blood pressure 143/78, pulse 70, temperature 98.2 F (36.8 C), temperature source Oral, resp. rate 16, height  (1.626 m), weight 63.685 kg (140 lb 6.4 oz), SpO2 98 %.  PHYSICAL EXAMINATION:   Physical Exam  Constitutional: She is well-developed, well-nourished, and in no distress. No distress.  HENT:  Head: Normocephalic and atraumatic.  Eyes: No scleral icterus.  Neck: Neck supple. No JVD present. No tracheal deviation present.  Cardiovascular: Normal rate, regular rhythm and normal heart sounds.  Exam reveals no gallop and no friction rub.   No murmur heard. Pulmonary/Chest: Effort normal and breath sounds normal. No respiratory distress. She has no wheezes. She has no rales. She exhibits no tenderness.  Abdominal: Soft. Bowel sounds are normal. She exhibits no distension and no mass. There is no tenderness. There is no rebound and no guarding.  Musculoskeletal: She exhibits no edema.  Neurological: She is alert.  Skin: Skin is warm. No rash noted. No erythema.      LABORATORY PANEL:   CBC  Recent Labs Lab 07/15/15 0343  WBC 15.8*  HGB 11.0*  HCT 33.5*  PLT 170   ------------------------------------------------------------------------------------------------------------------  Chemistries   Recent Labs Lab 07/14/15 2045  07/17/15 0043  NA 143  < > 143  K 3.4*  < > 3.9  CL 111  < > 114*  CO2 24  < > 23  GLUCOSE 181*  < > 126*  BUN 25*  < > 26*  CREATININE 0.97  < > 0.79  CALCIUM 8.5*  < > 8.0*  AST 26  --   --   ALT 14  --   --   ALKPHOS 78  --   --   BILITOT 0.5  --   --   < > = values  in this interval not displayed. ------------------------------------------------------------------------------------------------------------------  Cardiac Enzymes  Recent Labs Lab 07/14/15 2045  TROPONINI <0.03   ------------------------------------------------------------------------------------------------------------------  RADIOLOGY:  No results found.   ASSESSMENT AND PLAN:    82 year old female with dementia who presents with acute encephalopathy  1. Acute encephalopathy: This is due to Escherichia coli bacteremia with urinary tract infection. Blood cultures are positive for Escherichia coli. Stop meropenem and start IV ciprofloxacin as per sensitivities.  ID consult pending. Patient will require 2 weeks of antibiotics.   2. Essential hypertension: Continue atenolol.  3. Hypokalemia: Potassium improved.  4. Sepsis, present on admission: Patient with elevated lactic acid, and low-grade fever and elevated white blood cell count This is due to Escherichia coli bacteremia and urinary tract infection as mentioned above. Continue antibiotics     CODE STATUS: FULL  TOTAL TIME TAKING CARE OF THIS PATIENT: 25 minutes.    Physical therapy is recommending home health at discharge POSSIBLE D/C 2-3 days, DEPENDING ON CLINICAL CONDITION.   Tiyah Zelenak M.D on 07/17/2015 at 9:41 AM  Between 7am to 6pm - Pager - [phone] After 6pm go to [email or web address] - password [email or web address] [facility]  [place] [facility]  Office  [phone]  CC: Primary care physician; No primary care provider on file.  Note: This dictation was prepared  with Dragon dictation along with smaller phrase technology. Any transcriptional errors that result from this process are unintentional.

## 2015-07-17 NOTE — Progress Notes (Addendum)
Rest without O2=88% Rest with 3L O2=98% Walk without O2=86%  Had to stop test d/t weakness.

## 2015-07-17 NOTE — Progress Notes (Signed)
Patient is being discharged to home with daughter. Once [facility] delivers O2 call daughter to pick up patient and to go over discharge & Rx instructions. Home Rx sent electronically to [facility] in [place] but unable to verify since pharmacy is closed. Removed IV and Tech got patient dressed and ready to go home. VSS at time of discharge.

## 2015-07-18 LAB — CULTURE, BLOOD (ROUTINE X 2)

## 2015-07-22 LAB — CULTURE, BLOOD (ROUTINE X 2)
CULTURE: NO GROWTH
CULTURE: NO GROWTH

## 2015-09-06 ENCOUNTER — Emergency Department: Payer: Medicare Other

## 2015-09-06 ENCOUNTER — Encounter: Payer: Self-pay | Admitting: Radiology

## 2015-09-06 ENCOUNTER — Emergency Department
Admission: EM | Admit: 2015-09-06 | Discharge: 2015-09-07 | Payer: Medicare Other | Attending: Emergency Medicine | Admitting: Emergency Medicine

## 2015-09-06 DIAGNOSIS — Y929 Unspecified place or not applicable: Secondary | ICD-10-CM | POA: Diagnosis not present

## 2015-09-06 DIAGNOSIS — W010XXA Fall on same level from slipping, tripping and stumbling without subsequent striking against object, initial encounter: Secondary | ICD-10-CM | POA: Insufficient documentation

## 2015-09-06 DIAGNOSIS — F0390 Unspecified dementia without behavioral disturbance: Secondary | ICD-10-CM | POA: Diagnosis not present

## 2015-09-06 DIAGNOSIS — R51 Headache: Secondary | ICD-10-CM | POA: Diagnosis not present

## 2015-09-06 DIAGNOSIS — J449 Chronic obstructive pulmonary disease, unspecified: Secondary | ICD-10-CM | POA: Insufficient documentation

## 2015-09-06 DIAGNOSIS — Z79899 Other long term (current) drug therapy: Secondary | ICD-10-CM | POA: Diagnosis not present

## 2015-09-06 DIAGNOSIS — Y999 Unspecified external cause status: Secondary | ICD-10-CM | POA: Insufficient documentation

## 2015-09-06 DIAGNOSIS — Z792 Long term (current) use of antibiotics: Secondary | ICD-10-CM | POA: Insufficient documentation

## 2015-09-06 DIAGNOSIS — R109 Unspecified abdominal pain: Secondary | ICD-10-CM | POA: Diagnosis not present

## 2015-09-06 DIAGNOSIS — Y9389 Activity, other specified: Secondary | ICD-10-CM | POA: Insufficient documentation

## 2015-09-06 DIAGNOSIS — I10 Essential (primary) hypertension: Secondary | ICD-10-CM | POA: Diagnosis not present

## 2015-09-06 DIAGNOSIS — M545 Low back pain: Secondary | ICD-10-CM | POA: Diagnosis present

## 2015-09-06 DIAGNOSIS — W19XXXA Unspecified fall, initial encounter: Secondary | ICD-10-CM

## 2015-09-06 LAB — BASIC METABOLIC PANEL WITH GFR
Anion gap: 9 (ref 5–15)
BUN: 14 mg/dL (ref 6–20)
CO2: 25 mmol/L (ref 22–32)
Calcium: 8.9 mg/dL (ref 8.9–10.3)
Chloride: 108 mmol/L (ref 101–111)
Creatinine, Ser: 0.69 mg/dL (ref 0.44–1.00)
GFR calc Af Amer: >60 mL/min
GFR calc non Af Amer: >60 mL/min
Glucose, Bld: 101 mg/dL — ABNORMAL HIGH (ref 65–99)
Potassium: 3.6 mmol/L (ref 3.5–5.1)
Sodium: 142 mmol/L (ref 135–145)

## 2015-09-06 LAB — CBC WITH DIFFERENTIAL/PLATELET
Basophils Absolute: 0.1 K/uL (ref 0–0.1)
Basophils Relative: 2 %
Eosinophils Absolute: 0.4 K/uL (ref 0–0.7)
Eosinophils Relative: 5 %
HCT: 37.8 % (ref 35.0–47.0)
Hemoglobin: 12.7 g/dL (ref 12.0–16.0)
Lymphocytes Relative: 16 %
Lymphs Abs: 1.2 K/uL (ref 1.0–3.6)
MCH: 29.1 pg (ref 26.0–34.0)
MCHC: 33.7 g/dL (ref 32.0–36.0)
MCV: 86.2 fL (ref 80.0–100.0)
Monocytes Absolute: 0.5 K/uL (ref 0.2–0.9)
Monocytes Relative: 6 %
Neutro Abs: 5.6 K/uL (ref 1.4–6.5)
Neutrophils Relative %: 71 %
Platelets: 274 K/uL (ref 150–440)
RBC: 4.38 MIL/uL (ref 3.80–5.20)
RDW: 14.6 % — ABNORMAL HIGH (ref 11.5–14.5)
WBC: 7.9 K/uL (ref 3.6–11.0)

## 2015-09-06 MED ORDER — MORPHINE SULFATE (PF) 2 MG/ML IV SOLN
2.0000 mg | Freq: Once | INTRAVENOUS | Status: DC
  Filled 2015-09-06: qty 1

## 2015-09-06 MED ORDER — IOPAMIDOL (ISOVUE-300) INJECTION 61%
100.0000 mL | Freq: Once | INTRAVENOUS | Status: AC | PRN
  Administered 2015-09-06: 100 mL via INTRAVENOUS

## 2015-09-06 MED ORDER — NAPROXEN 500 MG PO TABS
250.0000 mg | ORAL_TABLET | Freq: Once | ORAL | Status: AC
  Administered 2015-09-06: 250 mg via ORAL
  Filled 2015-09-06: qty 1

## 2015-09-06 MED ORDER — ONDANSETRON HCL 4 MG/2ML IJ SOLN
4.0000 mg | Freq: Once | INTRAMUSCULAR | Status: DC
  Filled 2015-09-06: qty 2

## 2015-09-06 NOTE — ED Notes (Signed)
Assisted patient with bedpan. Pt unable to void at this time.

## 2015-09-06 NOTE — ED Notes (Signed)
Pt discharged to aems for transport, this RN talked to daughter who lives at home with pt and told her pt was being discharged. EMS found no one at the home and pt not stable to leave by self. Daughter has been tried to reach again with no answer. Pt stable and holding in ED at this time.

## 2015-09-06 NOTE — ED Notes (Addendum)
Wynelle FannySheila Jeffries:  [phone] (Daughter)

## 2015-09-06 NOTE — ED Notes (Signed)
Pt still denies pain at this time, RN will give morphine when pt begins to c/o pain

## 2015-09-06 NOTE — Discharge Instructions (Signed)
You have been seen in the emergency department today for a fall. Your workup has not shown any acute abnormalities. Please use your walker at home. Return to the emergency department for any increased pain, any further falls, or any other symptom personally concerning to yourself.   Fall Prevention in the Home  Falls can cause injuries and can affect people from all age groups. There are many simple things that you can do to make your home safe and to help prevent falls.  WHAT CAN I DO ON THE OUTSIDE OF MY HOME?  Regularly repair the edges of walkways and driveways and fix any cracks.  Remove high doorway thresholds.  Trim any shrubbery on the main path into your home.  Use bright outdoor lighting.  Clear walkways of debris and clutter, including tools and rocks.  Regularly check that handrails are securely fastened and in good repair. Both sides of any steps should have handrails.  Install guardrails along the edges of any raised decks or porches.  Have leaves, snow, and ice cleared regularly.  Use sand or salt on walkways during winter months.  In the garage, clean up any spills right away, including grease or oil spills. WHAT CAN I DO IN THE BATHROOM?  Use night lights.  Install grab bars by the toilet and in the tub and shower. Do not use towel bars as grab bars.  Use non-skid mats or decals on the floor of the tub or shower.  If you need to sit down while you are in the shower, use a plastic, non-slip stool.Marland Kitchen  Keep the floor dry. Immediately clean up any water that spills on the floor.  Remove soap buildup in the tub or shower on a regular basis.  Attach bath mats securely with double-sided non-slip rug tape.  Remove throw rugs and other tripping hazards from the floor. WHAT CAN I DO IN THE BEDROOM?  Use night lights.  Make sure that a bedside light is easy to reach.  Do not use oversized bedding that drapes onto the floor.  Have a firm chair that has side arms to use for getting  dressed.  Remove throw rugs and other tripping hazards from the floor. WHAT CAN I DO IN THE KITCHEN?  Clean up any spills right away.  Avoid walking on wet floors.  Place frequently used items in easy-to-reach places.  If you need to reach for something above you, use a sturdy step stool that has a grab bar.  Keep electrical cables out of the way.  Do not use floor polish or wax that makes floors slippery. If you have to use wax, make sure that it is non-skid floor wax.  Remove throw rugs and other tripping hazards from the floor. WHAT CAN I DO IN THE STAIRWAYS?  Do not leave any items on the stairs.  Make sure that there are handrails on both sides of the stairs. Fix handrails that are broken or loose. Make sure that handrails are as long as the stairways.  Check any carpeting to make sure that it is firmly attached to the stairs. Fix any carpet that is loose or worn.  Avoid having throw rugs at the top or bottom of stairways, or secure the rugs with carpet tape to prevent them from moving.  Make sure that you have a light switch at the top of the stairs and the bottom of the stairs. If you do not have them, have them installed. WHAT ARE SOME OTHER FALL PREVENTION  TIPS?  Wear closed-toe shoes that fit well and support your feet. Wear shoes that have rubber soles or low heels.  When you use a stepladder, make sure that it is completely opened and that the sides are firmly locked. Have someone hold the ladder while you are using it. Do not climb a closed stepladder.  Add color or contrast paint or tape to grab bars and handrails in your home. Place contrasting color strips on the first and last steps.  Use mobility aids as needed, such as canes, walkers, scooters, and crutches.  Turn on lights if it is dark. Replace any light bulbs that burn out.  Set up furniture so that there are clear paths. Keep the furniture in the same spot.  Fix any uneven floor surfaces.  Choose a carpet design that  does not hide the edge of steps of a stairway.  Be aware of any and all pets.  Review your medicines with your healthcare provider. Some medicines can cause dizziness or changes in blood pressure, which increase your risk of falling. Talk with your health care provider about other ways that you can decrease your risk of falls. This may include working with a physical therapist or trainer to improve your strength, balance, and endurance.  This information is not intended to replace advice given to you by your health care provider. Make sure you discuss any questions you have with your health care provider.  Document Released: 04/27/2002 Document Revised: 09/21/2014 Document Reviewed: 06/11/2014  Elsevier Interactive Patient Education [employer]2016 [employer]

## 2015-09-06 NOTE — ED Provider Notes (Signed)
-----------------------------------------   11:42 PM on 09/06/2015 -----------------------------------------  EMS brought the patient home however upon arrival to the home the daughter was not there. EMS was unable to leave the patient home unattended so they brought her back to the emergency department. Patient has no new complaints. Patient requires social work consultation to help figure out a sufficient living situation for the patient. At this time the patient does not meet admission criteria, nor do I see any reason to admit the patient to the hospital. We will await social worker consultation in the morning.  Minna AntisKevin Mariela Rex, MD 09/06/15 [phone]

## 2015-09-06 NOTE — ED Notes (Signed)
Pt arrives via [place] for fall.  C/o of left lower back pain

## 2015-09-06 NOTE — ED Notes (Addendum)
Pt ambulated about 10 feet with 2 nurse assist.

## 2015-09-06 NOTE — ED Provider Notes (Signed)
-----------------------------------------   8:37 PM on 09/06/2015 -----------------------------------------  CT shows no acute abnormalities. Labs are largely within normal limits. Patient was able to ambulate with minimal assistance from the nurse. Patient uses a walker at home. At this point I do not believe the patient requires admission to the hospital given her largely normal workup. We'll discharge the patient with primary care follow-up as soon as possible.  Minna AntisKevin Ferrin Liebig, MD 09/06/15 2038

## 2015-09-06 NOTE — ED Provider Notes (Signed)
[facility] Provider Note  ____________________________________________  Time seen: Approximately 12 PM  I have reviewed the triage vital signs and the nursing notes.   HISTORY  Chief Complaint Fall and Back Pain   HPI Katherine Bartlett is a 82 y.o. female with a history of dementia and osteoporosis who is presenting to the emergency department today after a slip and fall earlier today. The patient says that she was walking when she slipped and fell onto the right side of her back. She says that she may have hit her head as well but is only experiencing a mild headache at this time and occiput. Says that she is also broken her neck in the past and also is complaining of some mild neck pain at posteriorly as well. She describes the majority of her pain in her right lower back. Denies any pain in her pelvis or hips. The pain is nonradiating and worse with movement and palpation. Denies any loss of consciousness after this fall.   Past Medical History  Diagnosis Date  . Dementia   . Osteoporosis   . Hypertension   . COPD (chronic obstructive pulmonary disease) [facility])     Patient Active Problem List   Diagnosis Date Noted  . UTI (lower urinary tract infection) 07/14/2015  . Fracture of cervical vertebra (HCC) 09/11/2012  . Chronic obstructive pulmonary disease (HCC) 09/11/2012  . BP (high blood pressure) 09/11/2012    Past Surgical History  Procedure Laterality Date  . Back surgery      Current Outpatient Rx  Name  Route  Sig  Dispense  Refill  . albuterol (PROVENTIL HFA;VENTOLIN HFA) 108 (90 Base) MCG/ACT inhaler   Inhalation   Inhale 2 puffs into the lungs every 6 (six) hours as needed for wheezing or shortness of breath.         Marland Kitchen alendronate (FOSAMAX) 70 MG tablet   Oral   Take 70 mg by mouth once a week. Take with a full glass of water on an empty stomach.         Marland Kitchen atenolol (TENORMIN) 25 MG tablet   Oral   Take 25  mg by mouth daily.         . Calcium Carbonate-Vitamin D (CALCIUM 600+D) 600-400 MG-UNIT tablet   Oral   Take 1 tablet by mouth 2 (two) times daily.         . ciprofloxacin (CIPRO) 500 MG tablet   Oral   Take 1 tablet (500 mg total) by mouth 2 (two) times daily.   21 tablet   0   . Fluticasone-Salmeterol (ADVAIR) 250-50 MCG/DOSE AEPB   Inhalation   Inhale 1 puff into the lungs 2 (two) times daily.         Marland Kitchen galantamine (RAZADYNE) 8 MG tablet   Oral   Take 8 mg by mouth 2 (two) times daily.         Marland Kitchen tiotropium (SPIRIVA) 18 MCG inhalation capsule   Inhalation   Place 18 mcg into inhaler and inhale daily.           Allergies Review of patient's allergies indicates no known allergies.  No family history on file.  Social History Social History  Substance Use Topics  . Smoking status: Never Smoker   . Smokeless tobacco: Not on file  . Alcohol Use: No    Review of Systems Constitutional: No fever/chills Eyes: No visual changes. ENT: No sore throat. Cardiovascular: Denies chest pain. Respiratory:  Denies shortness of breath. Gastrointestinal: No abdominal pain.  No nausea, no vomiting.  No diarrhea.  No constipation. Genitourinary: Negative for dysuria. Musculoskeletal: As above Skin: Negative for rash. Neurological: Negative for headaches, focal weakness or numbness.  10-point ROS otherwise negative.  ____________________________________________   PHYSICAL EXAM:  VITAL SIGNS: ED Triage Vitals  Enc Vitals Group     BP 09/06/15 1008 141/97 mmHg     Pulse Rate 09/06/15 1008 81     Resp 09/06/15 1008 16     Temp 09/06/15 1008 98.3 F (36.8 C)     Temp Source 09/06/15 1008 Oral     SpO2 09/06/15 1008 98 %     Weight --      Height --      Head Cir --      Peak Flow --      Pain Score --      Pain Loc --      Pain Edu? --      Excl. in GC? --     Constitutional: Alert and oriented. Well appearing and in no acute distress. Eyes: Conjunctivae  are normal. PERRL. EOMI. Head: Atraumatic. Nose: No congestion/rhinnorhea. Mouth/Throat: Mucous membranes are moist.  Oropharynx non-erythematous. Neck: No stridor.  No tenderness palpation to the posterior C-spine. No deformity or step-off. Cardiovascular: Normal rate, regular rhythm. Grossly normal heart sounds.  Good peripheral circulation. Respiratory: Normal respiratory effort.  No retractions. Lungs CTAB. Gastrointestinal: Soft and nontender. No distention. No abdominal bruits. Right-sided CVA tenderness palpation. No crepitus or step-off or deformity. Musculoskeletal: No lower extremity tenderness nor edema.  No joint effusions. Also with tenderness over the right iliac crest. Pelvis is stable. No tenderness to the hips. No lower extremity tenderness or swelling. Neurologic:  Normal speech and language. No gross focal neurologic deficits are appreciated. 5 out of 5 strength throughout. Skin:  Skin is warm, dry and intact. No rash noted. Psychiatric: Mood and affect are normal. Speech and behavior are normal.  ____________________________________________   LABS (all labs ordered are listed, but only abnormal results are displayed)  Labs Reviewed  URINALYSIS COMPLETEWITH MICROSCOPIC ([facility] ONLY)  CBC WITH DIFFERENTIAL/PLATELET  BASIC METABOLIC PANEL   ____________________________________________  EKG  ED ECG REPORT I, Schaevitz,  Teena Irani, the attending physician, personally viewed and interpreted this ECG.   Date: 09/06/2015  EKG Time: 956  Rate: 62  Rhythm: normal sinus rhythm  Axis: Normal  Intervals:Short PR  ST&T Change: No ST segment elevation or depression. No abnormal T-wave inversions.  ____________________________________________  RADIOLOGY  Imaging Results       DG Pelvis 1-2 Views (Final result) Result time: 09/06/15 13:11:49   Final result by Rad Results In Interface (09/06/15 13:11:49)   Narrative:   CLINICAL DATA: Low back and left pelvic pain  after falling to the floor today. Initial encounter.  EXAM: PELVIS - 1-2 VIEW  COMPARISON: Lumbar spine radiograph same date. Sacral radiographs 03/02/2015.  FINDINGS: The bones are demineralized. There are stable posttraumatic deformities of the right pubic rami. No evidence of acute fracture or dislocation. Large bladder calculus again noted.  IMPRESSION: No acute osseous findings in the pelvis. Large bladder calculus.   Electronically Signed By: Carey Bullocks M.D. On: 09/06/2015 13:11          DG Lumbar Spine Complete (Final result) Result time: 09/06/15 13:10:13   Final result by Rad Results In Interface (09/06/15 13:10:13)   Narrative:   CLINICAL DATA: Low back and left-sided pain after falling on  floor today. Initial encounter.  EXAM: LUMBAR SPINE - COMPLETE 4+ VIEW  COMPARISON: Lumbar radiographs 03/02/2015.  FINDINGS: Five lumbar type vertebral bodies. The bones are demineralized. No acute osseous findings are seen within the lumbar spine. T12 fracture has progressed with greater than 75% loss of vertebral body height. A mild compression deformity at T11 appears stable. There are stable intervertebral degenerative changes throughout the lumbar spine.  IMPRESSION: Progressive loss of height at previous demonstrated T12 fracture. No definite acute findings.   Electronically Signed By: Carey Bullocks M.D. On: 09/06/2015 13:10          CT Head Wo Contrast (Final result) Result time: 09/06/15 13:05:55   Final result by Rad Results In Interface (09/06/15 13:05:55)   Narrative:   CLINICAL DATA: Larey Seat today, dementia, hypertension, COPD  EXAM: CT HEAD WITHOUT CONTRAST  CT CERVICAL SPINE WITHOUT CONTRAST  TECHNIQUE: Multidetector CT imaging of the head and cervical spine was performed following the standard protocol without intravenous contrast. Multiplanar CT image reconstructions of the cervical spine were also  generated.  COMPARISON: CT head 06/17/2015, CT cervical spine 09/10/2012  FINDINGS: CT HEAD FINDINGS  Generalized atrophy.  Normal ventricular morphology.  No midline shift or mass effect.  Small vessel chronic ischemic changes of deep cerebral white matter.  Old thalamic and RIGHT basal ganglia lacunar infarcts.  Benign-appearing basal ganglia calcifications.  No intracranial hemorrhage, mass lesion, or evidence acute infarction.  No extra-axial fluid collections.  Partial opacification of BILATERAL ethmoid air cells, maxillary sinuses, and frontal sinus.  Diffuse osseous demineralization without calvarial fracture.  Atherosclerotic calcifications of internal carotid and vertebral arteries at skullbase.  CT CERVICAL SPINE FINDINGS  Scattered atherosclerotic calcifications.  Bones demineralized.  Visualized skullbase intact.  Prior posterior cervical fusion C4-T3.  Prior anterior fusion C6-C7.  Vertebral body heights maintained without fracture or subluxation.  Scattered degenerative disc and facet disease changes throughout cervical spine.  Biapical scarring.  IMPRESSION: Atrophy with small vessel chronic ischemic changes of deep cerebral white matter.  Old lacunar infarcts at BILATERAL thalamus and RIGHT basal ganglia.  No acute intracranial abnormalities.  Multilevel degenerative disc and facet disease changes of the cervical spine.  If prior anterior and posterior cervical spine fusions as above.  No acute cervical spine abnormalities.   Electronically Signed By: Ulyses Southward M.D. On: 09/06/2015 13:05          CT Cervical Spine Wo Contrast (Final result) Result time: 09/06/15 13:05:55   Final result by Rad Results In Interface (09/06/15 13:05:55)   Narrative:   CLINICAL DATA: Larey Seat today, dementia, hypertension, COPD  EXAM: CT HEAD WITHOUT CONTRAST  CT CERVICAL SPINE WITHOUT CONTRAST  TECHNIQUE: Multidetector CT imaging  of the head and cervical spine was performed following the standard protocol without intravenous contrast. Multiplanar CT image reconstructions of the cervical spine were also generated.  COMPARISON: CT head 06/17/2015, CT cervical spine 09/10/2012  FINDINGS: CT HEAD FINDINGS  Generalized atrophy.  Normal ventricular morphology.  No midline shift or mass effect.  Small vessel chronic ischemic changes of deep cerebral white matter.  Old thalamic and RIGHT basal ganglia lacunar infarcts.  Benign-appearing basal ganglia calcifications.  No intracranial hemorrhage, mass lesion, or evidence acute infarction.  No extra-axial fluid collections.  Partial opacification of BILATERAL ethmoid air cells, maxillary sinuses, and frontal sinus.  Diffuse osseous demineralization without calvarial fracture.  Atherosclerotic calcifications of internal carotid and vertebral arteries at skullbase.  CT CERVICAL SPINE FINDINGS  Scattered atherosclerotic calcifications.  Bones demineralized.  Visualized  skullbase intact.  Prior posterior cervical fusion C4-T3.  Prior anterior fusion C6-C7.  Vertebral body heights maintained without fracture or subluxation.  Scattered degenerative disc and facet disease changes throughout cervical spine.  Biapical scarring.  IMPRESSION: Atrophy with small vessel chronic ischemic changes of deep cerebral white matter.  Old lacunar infarcts at BILATERAL thalamus and RIGHT basal ganglia.  No acute intracranial abnormalities.  Multilevel degenerative disc and facet disease changes of the cervical spine.  If prior anterior and posterior cervical spine fusions as above.  No acute cervical spine abnormalities.   Electronically Signed By: Ulyses SouthwardMark Boles M.D. On: 09/06/2015 13:05    ____________________________________________   PROCEDURES  ____________________________________________   INITIAL IMPRESSION / ASSESSMENT AND PLAN / ED  COURSE  Pertinent labs & imaging results that were available during my care of the patient were reviewed by me and considered in my medical decision making (see chart for details).  ----------------------------------------- 4:21 PM on 09/06/2015 -----------------------------------------  Patient pain with mild improvement after Naprosyn. However, still with moderate to severe pain to the right flank and the patient moves. Attempted to sit the patient up on the side of the bed and she was unable to do so secondary to this pain. We also attempted a straight catheter urine but the patient was combative. We'll CAT scan the abdomen and pelvis to make sure there are no other signs of traumatic injury. I also attempted to call the family but the home as well as the cell phone number listed and left a message on the home number. However, there was no option of leaving messages on the cell phone. Signed out to Dr. Lenard LancePaduchowski. ____________________________________________   FINAL CLINICAL IMPRESSION(S) / ED DIAGNOSES  Right flank pain. Mechanical fall.    Myrna Blazeravid Matthew Schaevitz, MD 09/06/15 [phone]

## 2015-09-06 NOTE — ED Notes (Addendum)
RN reached daughter who pt lives with, daughter refuses to come get pt due to no vehicle. No other relatives/friends to call per daughter. MD notified

## 2015-09-07 NOTE — ED Provider Notes (Signed)
-----------------------------------------   8:56 AM on 09/07/2015 -----------------------------------------  The patient is awake, ambulating with some assistance (she normally ambulates with walker", appears well. Her vital signs are stable. Her daughter has arrived to pick her up. Her daughter is her primary caregiver and reports that she has plenty of his help at home to take care of the patient and she wants to take her home. She has a CNA there. She reports that she was only not home last night because she had switched to third shift so she was not able to receive the patient when EMS arrived. We'll DC with return precautions.  Gayla DossEryka A Conception Doebler, MD 09/07/15 [phone]

## 2015-09-07 NOTE — ED Notes (Signed)
Received call from pt's daughter - she reports that she was at work last pm when pt was transferred to home and she is on her way to pick her mother up - MD Inocencio HomesGayle informed   Social work consult to be cancelled pending discharge instructions after daughter arrives  Pt continues to rest quietly with her eyes closed

## 2015-09-07 NOTE — ED Notes (Signed)
Pt asked to put on skirt, pt agitated in regards to putting on her skirt.  Allowed pt to put skirt on and settled back into bed.  Pt provided with a warm blanket and resting well at this time.

## 2015-09-07 NOTE — ED Notes (Addendum)
Patient observed lying in bed with eyes closed  Even, unlabored respirations observed   NAD pt appears to be sleeping  I will continue to monitor  

## 2015-10-14 ENCOUNTER — Emergency Department
Admission: EM | Admit: 2015-10-14 | Discharge: 2015-10-14 | Disposition: A | Discharge status: Home / Self Care | Payer: Medicare Other | Attending: Emergency Medicine | Admitting: Emergency Medicine

## 2015-10-14 ENCOUNTER — Emergency Department: Payer: Medicare Other

## 2015-10-14 ENCOUNTER — Encounter: Payer: Self-pay | Admitting: Emergency Medicine

## 2015-10-14 DIAGNOSIS — J449 Chronic obstructive pulmonary disease, unspecified: Secondary | ICD-10-CM | POA: Diagnosis not present

## 2015-10-14 DIAGNOSIS — M810 Age-related osteoporosis without current pathological fracture: Secondary | ICD-10-CM | POA: Diagnosis not present

## 2015-10-14 DIAGNOSIS — Z79899 Other long term (current) drug therapy: Secondary | ICD-10-CM | POA: Insufficient documentation

## 2015-10-14 DIAGNOSIS — I10 Essential (primary) hypertension: Secondary | ICD-10-CM | POA: Insufficient documentation

## 2015-10-14 DIAGNOSIS — R0602 Shortness of breath: Secondary | ICD-10-CM | POA: Diagnosis present

## 2015-10-14 DIAGNOSIS — Z7982 Long term (current) use of aspirin: Secondary | ICD-10-CM | POA: Insufficient documentation

## 2015-10-14 DIAGNOSIS — R062 Wheezing: Secondary | ICD-10-CM | POA: Insufficient documentation

## 2015-10-14 DIAGNOSIS — R0600 Dyspnea, unspecified: Secondary | ICD-10-CM | POA: Diagnosis not present

## 2015-10-14 LAB — COMPREHENSIVE METABOLIC PANEL
ALK PHOS: 138 U/L — AB (ref 38–126)
ALT: 13 U/L — ABNORMAL LOW (ref 14–54)
ANION GAP: 8 (ref 5–15)
AST: 20 U/L (ref 15–41)
Albumin: 3.9 g/dL (ref 3.5–5.0)
BILIRUBIN TOTAL: 0.5 mg/dL (ref 0.3–1.2)
BUN: 21 mg/dL — ABNORMAL HIGH (ref 6–20)
CALCIUM: 9.1 mg/dL (ref 8.9–10.3)
CO2: 27 mmol/L (ref 22–32)
Chloride: 106 mmol/L (ref 101–111)
Creatinine, Ser: 0.62 mg/dL (ref 0.44–1.00)
Glucose, Bld: 139 mg/dL — ABNORMAL HIGH (ref 65–99)
POTASSIUM: 3.7 mmol/L (ref 3.5–5.1)
Sodium: 141 mmol/L (ref 135–145)
TOTAL PROTEIN: 7.5 g/dL (ref 6.5–8.1)

## 2015-10-14 LAB — BLOOD GAS, VENOUS
Acid-Base Excess: 2 mmol/L (ref 0.0–3.0)
Bicarbonate: 28.7 mEq/L — ABNORMAL HIGH (ref 21.0–28.0)
PCO2 VEN: 52 mmHg (ref 44.0–60.0)
Patient temperature: 37
pH, Ven: 7.35 (ref 7.320–7.430)

## 2015-10-14 LAB — CBC
HEMATOCRIT: 41.9 % (ref 35.0–47.0)
Hemoglobin: 13.9 g/dL (ref 12.0–16.0)
MCH: 29 pg (ref 26.0–34.0)
MCHC: 33.1 g/dL (ref 32.0–36.0)
MCV: 87.4 fL (ref 80.0–100.0)
Platelets: 322 10*3/uL (ref 150–440)
RBC: 4.8 MIL/uL (ref 3.80–5.20)
RDW: 15.1 % — AB (ref 11.5–14.5)
WBC: 12.6 10*3/uL — ABNORMAL HIGH (ref 3.6–11.0)

## 2015-10-14 LAB — BRAIN NATRIURETIC PEPTIDE: B NATRIURETIC PEPTIDE 5: 28 pg/mL (ref 0.0–100.0)

## 2015-10-14 LAB — TROPONIN I

## 2015-10-14 MED ORDER — PREDNISONE 20 MG PO TABS: 40.0000 mg | ORAL_TABLET | Freq: Every day | ORAL | Status: DC

## 2015-10-14 MED ORDER — ALBUTEROL SULFATE (2.5 MG/3ML) 0.083% IN NEBU
2.5000 mg | INHALATION_SOLUTION | Freq: Once | RESPIRATORY_TRACT | Status: AC
  Administered 2015-10-14: 2.5 mg via RESPIRATORY_TRACT

## 2015-10-14 MED ORDER — ALBUTEROL SULFATE (2.5 MG/3ML) 0.083% IN NEBU
2.5000 mg | INHALATION_SOLUTION | Freq: Once | RESPIRATORY_TRACT | Status: AC
Start: 2015-10-14 — End: 2015-10-14
  Administered 2015-10-14: 2.5 mg via RESPIRATORY_TRACT

## 2015-10-14 NOTE — ED Notes (Signed)
Spoke with Velna HatchetSheila. She will be here in the next hour to pick up her mom

## 2015-10-14 NOTE — ED Notes (Addendum)
Pt to rm 19 via EMS from home, EMS report pt hx COPD w/o exacerbation for couple years but woke up SOB audible wheezing.  Ems report pt RA o2sat 90%.  3 duonebs, 125 solu-medrol, and 2g magnesium given along w/ NS.  Pt NAD upon arrival, audible wheezing remains, denies pain and fever.

## 2015-10-14 NOTE — ED Notes (Signed)
Phone call made to daughter sheila about patients discharge. Katherine HatchetSheila did not answer and a message was left

## 2015-10-14 NOTE — ED Notes (Signed)
Called pt's daughter regarding pt's status and med rec.  Daughter Katherine Bartlett request to stay updated, [phone]

## 2015-10-14 NOTE — Discharge Instructions (Signed)
Please seek medical attention for any high fevers, chest pain, shortness of breath, change in behavior, persistent vomiting, bloody stool or any other new or concerning symptoms. ° ° °Shortness of Breath °Shortness of breath means you have trouble breathing. Shortness of breath needs medical care right away. °HOME CARE  °· Do not smoke. °· Avoid being around chemicals or things (paint fumes, dust) that may bother your breathing. °· Rest as needed. Slowly begin your normal activities. °· Only take medicines as told by your doctor. °· Keep all doctor visits as told. °GET HELP RIGHT AWAY IF:  °· Your shortness of breath gets worse. °· You feel lightheaded, pass out (faint), or have a cough that is not helped by medicine. °· You cough up blood. °· You have pain with breathing. °· You have pain in your chest, arms, shoulders, or belly (abdomen). °· You have a fever. °· You cannot walk up stairs or exercise the way you normally do. °· You do not get better in the time expected. °· You have a hard time doing normal activities even with rest. °· You have problems with your medicines. °· You have any new symptoms. °MAKE SURE YOU: °· Understand these instructions. °· Will watch your condition. °· Will get help right away if you are not doing well or get worse. °  °This information is not intended to replace advice given to you by your health care provider. Make sure you discuss any questions you have with your health care provider. °  °Document Released: 10/24/2007 Document Revised: 05/12/2013 Document Reviewed: 07/23/2011 °Elsevier Interactive Patient Education ©2016 [employer] ° °

## 2015-10-14 NOTE — ED Provider Notes (Signed)
-----------------------------------------   8:20 AM on 10/14/2015 -----------------------------------------  Patient states she feels much better at this time. On my exam patient without any wheezing on auscultation. No increased respiratory effort or distress. Patient does state she feels she could go home safely at this point. Will discharge with steroids. Discussed return precautions with the patient.   Phineas SemenGraydon Jayelyn Barno, MD 10/14/15 [phone]

## 2015-10-14 NOTE — ED Provider Notes (Signed)
[facility] Emergency Department Provider Note   ____________________________________________  Time seen: Approximately 5:50 AM  I have reviewed the triage vital signs and the nursing notes.   HISTORY  Chief Complaint Shortness of Breath  History limited as patient with a history of dementia.  HPI Katherine Bartlett is a 82 y.o. female who comes into the hospital today with shortness of breath. According to EMS the patient has a history of dementia and COPD. The patient's daughter called because she realized the patient was having some trouble breathing. EMS reports that they can hear the patient wheezing from across the room but the patient reports that she feels fine and nothing was wrong. EMS reports that she's had no fevers at home and she last saw Dr. Meredeth Ide multiple years ago. The patient has received 3 DuoNeb's, Solu-Medrol and magnesium sulfate by EMS. She is here for evaluation. Her oxygen saturations per EMS were 90%.   Past Medical History  Diagnosis Date  . Dementia   . Osteoporosis   . Hypertension   . COPD (chronic obstructive pulmonary disease) [facility])     Patient Active Problem List   Diagnosis Date Noted  . UTI (lower urinary tract infection) 07/14/2015  . Fracture of cervical vertebra (HCC) 09/11/2012  . Chronic obstructive pulmonary disease (HCC) 09/11/2012  . BP (high blood pressure) 09/11/2012    Past Surgical History  Procedure Laterality Date  . Back surgery      Current Outpatient Rx  Name  Route  Sig  Dispense  Refill  . albuterol (PROVENTIL HFA;VENTOLIN HFA) 108 (90 Base) MCG/ACT inhaler   Inhalation   Inhale 2 puffs into the lungs every 6 (six) hours as needed for wheezing or shortness of breath.         Marland Kitchen aspirin 81 MG tablet   Oral   Take 81 mg by mouth daily.         Marland Kitchen atenolol (TENORMIN) 25 MG tablet   Oral   Take 25 mg by mouth daily.         . Calcium Carbonate-Vitamin D (CALCIUM 600+D) 600-400  MG-UNIT tablet   Oral   Take 1 tablet by mouth 2 (two) times daily.         . Fluticasone-Salmeterol (ADVAIR) 250-50 MCG/DOSE AEPB   Inhalation   Inhale 1 puff into the lungs 2 (two) times daily.         Marland Kitchen galantamine (RAZADYNE) 8 MG tablet   Oral   Take 8 mg by mouth 2 (two) times daily.         Marland Kitchen tiotropium (SPIRIVA) 18 MCG inhalation capsule   Inhalation   Place 18 mcg into inhaler and inhale daily.         Marland Kitchen alendronate (FOSAMAX) 70 MG tablet   Oral   Take 70 mg by mouth once a week. Take with a full glass of water on an empty stomach.         . ciprofloxacin (CIPRO) 500 MG tablet   Oral   Take 1 tablet (500 mg total) by mouth 2 (two) times daily.   21 tablet   0     Allergies Review of patient's allergies indicates no known allergies.  History reviewed. No pertinent family history.  Social History Social History  Substance Use Topics  . Smoking status: Never Smoker   . Smokeless tobacco: None  . Alcohol Use: No    Review of Systems Constitutional: No fever/chills Eyes: No visual  changes. ENT: No sore throat. Cardiovascular: Denies chest pain. Respiratory: shortness of breath. Gastrointestinal: No abdominal pain.  No nausea, no vomiting.  No diarrhea.  No constipation. Genitourinary: Negative for dysuria. Musculoskeletal: Negative for back pain. Skin: Negative for rash. Neurological: Negative for headaches, focal weakness or numbness.  10-point ROS otherwise negative.  ____________________________________________   PHYSICAL EXAM:  VITAL SIGNS: ED Triage Vitals  Enc Vitals Group     BP 10/14/15 0554 173/93     Pulse 10/14/15 0554 110     Resp 10/14/15 0554 22     Temp 10/14/15 0554 97.1     Temp src --      SpO2 10/14/15 0554 92%     Weight 10/14/15 0554 130lb     Height --      Head Cir --      Peak Flow --      Pain Score --      Pain Loc --      Pain Edu? --      Excl. in GC? --     Constitutional: Alert and oriented. Well  appearing and in moderate distress. Eyes: Conjunctivae are normal. PERRL. EOMI. Head: Atraumatic. Nose: No congestion/rhinnorhea. Mouth/Throat: Mucous membranes are moist.  Oropharynx non-erythematous. Cardiovascular: Tachycardia, regular rhythm. Grossly normal heart sounds.  Good peripheral circulation. Respiratory: increased respiratory effort.  No retractions. Wheezes in all lung fields  Gastrointestinal: Soft and nontender. No distention. Positive bowel sounds Musculoskeletal: No lower extremity tenderness nor edema.   Neurologic:  Normal speech and language.  Skin:  Skin is warm, dry and intact.  Psychiatric: Mood and affect are normal.   ____________________________________________   LABS (all labs ordered are listed, but only abnormal results are displayed)  Labs Reviewed  CBC - Abnormal; Notable for the following:    WBC 12.6 (*)    RDW 15.1 (*)    All other components within normal limits  COMPREHENSIVE METABOLIC PANEL - Abnormal; Notable for the following:    Glucose, Bld 139 (*)    BUN 21 (*)    ALT 13 (*)    Alkaline Phosphatase 138 (*)    All other components within normal limits  BLOOD GAS, VENOUS - Abnormal; Notable for the following:    pO2, Ven <31.0 (*)    Bicarbonate 28.7 (*)    All other components within normal limits  TROPONIN I  BRAIN NATRIURETIC PEPTIDE   ____________________________________________  EKG  ED ECG REPORT I, Rebecka ApleyWebster,  Latesia Norrington P, the attending physician, personally viewed and interpreted this ECG.   Date: 10/14/2015  EKG Time: 553  Rate: 111  Rhythm: sinus tachycardia  Axis: normal  Intervals:none  ST&T Change: none  ____________________________________________  RADIOLOGY  CXR: No acute process ____________________________________________   PROCEDURES  Procedure(s) performed: None  Critical Care performed: No  ____________________________________________   INITIAL IMPRESSION / ASSESSMENT AND PLAN / ED  COURSE  Pertinent labs & imaging results that were available during my care of the patient were reviewed by me and considered in my medical decision making (see chart for details).  This is an 82 year old female who comes into the ED with SOB. The patient has received some breathing treatments and medication by EMS but she still has some significant wheezing. She will receive some albuterol and we will check some blood work.   The patient's care was signed out to Dr. Derrill KayGoodman who will reassess her and determine her disposition. ____________________________________________   FINAL CLINICAL IMPRESSION(S) / ED DIAGNOSES  Final diagnoses:  Wheezing  Dyspnea      NEW MEDICATIONS STARTED DURING THIS VISIT:  New Prescriptions   No medications on file     Note:  This document was prepared using Dragon voice recognition software and may include unintentional dictation errors.    Rebecka Apley, MD 10/14/15 [phone]

## 2015-11-06 ENCOUNTER — Emergency Department: Payer: Medicare Other

## 2015-11-06 ENCOUNTER — Inpatient Hospital Stay
Admission: EM | Admit: 2015-11-06 | Discharge: 2015-11-09 | DRG: 184 | Disposition: A | Discharge status: Skilled Nursing Facility | Payer: Medicare Other | Attending: Internal Medicine | Admitting: Internal Medicine

## 2015-11-06 ENCOUNTER — Encounter: Payer: Self-pay | Admitting: Emergency Medicine

## 2015-11-06 DIAGNOSIS — I10 Essential (primary) hypertension: Secondary | ICD-10-CM | POA: Diagnosis present

## 2015-11-06 DIAGNOSIS — M810 Age-related osteoporosis without current pathological fracture: Secondary | ICD-10-CM | POA: Diagnosis present

## 2015-11-06 DIAGNOSIS — Z7982 Long term (current) use of aspirin: Secondary | ICD-10-CM

## 2015-11-06 DIAGNOSIS — M47814 Spondylosis without myelopathy or radiculopathy, thoracic region: Secondary | ICD-10-CM | POA: Diagnosis present

## 2015-11-06 DIAGNOSIS — Z9889 Other specified postprocedural states: Secondary | ICD-10-CM | POA: Diagnosis not present

## 2015-11-06 DIAGNOSIS — S2239XA Fracture of one rib, unspecified side, initial encounter for closed fracture: Secondary | ICD-10-CM | POA: Diagnosis present

## 2015-11-06 DIAGNOSIS — Z79899 Other long term (current) drug therapy: Secondary | ICD-10-CM

## 2015-11-06 DIAGNOSIS — Y92009 Unspecified place in unspecified non-institutional (private) residence as the place of occurrence of the external cause: Secondary | ICD-10-CM | POA: Diagnosis not present

## 2015-11-06 DIAGNOSIS — S2241XA Multiple fractures of ribs, right side, initial encounter for closed fracture: Secondary | ICD-10-CM

## 2015-11-06 DIAGNOSIS — M47816 Spondylosis without myelopathy or radiculopathy, lumbar region: Secondary | ICD-10-CM | POA: Diagnosis present

## 2015-11-06 DIAGNOSIS — M4856XA Collapsed vertebra, not elsewhere classified, lumbar region, initial encounter for fracture: Secondary | ICD-10-CM | POA: Diagnosis not present

## 2015-11-06 DIAGNOSIS — F0390 Unspecified dementia without behavioral disturbance: Secondary | ICD-10-CM | POA: Diagnosis present

## 2015-11-06 DIAGNOSIS — E559 Vitamin D deficiency, unspecified: Secondary | ICD-10-CM | POA: Diagnosis present

## 2015-11-06 DIAGNOSIS — W19XXXA Unspecified fall, initial encounter: Secondary | ICD-10-CM | POA: Diagnosis present

## 2015-11-06 DIAGNOSIS — S32029A Unspecified fracture of second lumbar vertebra, initial encounter for closed fracture: Secondary | ICD-10-CM | POA: Diagnosis present

## 2015-11-06 DIAGNOSIS — M4854XA Collapsed vertebra, not elsewhere classified, thoracic region, initial encounter for fracture: Secondary | ICD-10-CM | POA: Diagnosis present

## 2015-11-06 DIAGNOSIS — J449 Chronic obstructive pulmonary disease, unspecified: Secondary | ICD-10-CM | POA: Diagnosis present

## 2015-11-06 DIAGNOSIS — S32000A Wedge compression fracture of unspecified lumbar vertebra, initial encounter for closed fracture: Secondary | ICD-10-CM

## 2015-11-06 LAB — COMPREHENSIVE METABOLIC PANEL
ALBUMIN: 3.8 g/dL (ref 3.5–5.0)
ALT: 12 U/L — AB (ref 14–54)
AST: 21 U/L (ref 15–41)
Alkaline Phosphatase: 67 U/L (ref 38–126)
Anion gap: 5 (ref 5–15)
BUN: 9 mg/dL (ref 6–20)
CHLORIDE: 108 mmol/L (ref 101–111)
CO2: 24 mmol/L (ref 22–32)
CREATININE: 0.78 mg/dL (ref 0.44–1.00)
Calcium: 9.3 mg/dL (ref 8.9–10.3)
GFR calc Af Amer: >60 mL/min (ref >60)
GFR calc non Af Amer: >60 mL/min (ref >60)
GLUCOSE: 94 mg/dL (ref 65–99)
POTASSIUM: 3.6 mmol/L (ref 3.5–5.1)
SODIUM: 137 mmol/L (ref 135–145)
Total Bilirubin: 0.2 mg/dL — ABNORMAL LOW (ref 0.3–1.2)
Total Protein: 6.2 g/dL — ABNORMAL LOW (ref 6.5–8.1)

## 2015-11-06 LAB — CBC
HCT: 39.5 % (ref 35.0–47.0)
HEMOGLOBIN: 13 g/dL (ref 12.0–16.0)
MCH: 28.9 pg (ref 26.0–34.0)
MCHC: 33 g/dL (ref 32.0–36.0)
MCV: 87.6 fL (ref 80.0–100.0)
PLATELETS: 297 10*3/uL (ref 150–440)
RBC: 4.5 MIL/uL (ref 3.80–5.20)
RDW: 14.8 % — ABNORMAL HIGH (ref 11.5–14.5)
WBC: 19.4 10*3/uL — AB (ref 3.6–11.0)

## 2015-11-06 MED ORDER — ONDANSETRON HCL 4 MG/2ML IJ SOLN
4.0000 mg | Freq: Once | INTRAMUSCULAR | Status: AC
  Administered 2015-11-06: 4 mg via INTRAVENOUS

## 2015-11-06 MED ORDER — FENTANYL CITRATE (PF) 100 MCG/2ML IJ SOLN
50.0000 ug | Freq: Once | INTRAMUSCULAR | Status: AC
  Administered 2015-11-06: 50 ug via INTRAVENOUS
  Filled 2015-11-06: qty 2

## 2015-11-06 MED ORDER — ONDANSETRON HCL 4 MG/2ML IJ SOLN
INTRAMUSCULAR | Status: AC
  Administered 2015-11-06: 4 mg via INTRAVENOUS
  Filled 2015-11-06: qty 2

## 2015-11-06 MED ORDER — MORPHINE SULFATE (PF) 2 MG/ML IV SOLN
2.0000 mg | Freq: Once | INTRAVENOUS | Status: AC
  Administered 2015-11-06: 2 mg via INTRAVENOUS
  Filled 2015-11-06: qty 1

## 2015-11-06 NOTE — ED Notes (Signed)
Pt presents to ED via EMS from home with witnessed fall c/o back pain on left side .

## 2015-11-06 NOTE — ED Provider Notes (Signed)
[facility] Emergency Department Provider Note  Time seen: 6:35 PM  I have reviewed the triage vital signs and the nursing notes.   HISTORY  Chief Complaint Fall    HPI Katherine Bartlett is a 82 y.o. female with a past medical history of dementia, hypertension, COPD who presents to the emergency department after a fall.According to the patient she was at home getting ready when she fell landing on her back and left side. Patient's confining her significant back pain from her upper back down into her pelvis. Is not sure if she hit her head. Denies LOC. Denies vomiting. Denies headache. Patient presented to the emergency department via EMS. Describes the pain as moderate located mostly throughout her entire back.     Past Medical History  Diagnosis Date  . Dementia   . Osteoporosis   . Hypertension   . COPD (chronic obstructive pulmonary disease) [facility](HCC)     Patient Active Problem List   Diagnosis Date Noted  . UTI (lower urinary tract infection) 07/14/2015  . Fracture of cervical vertebra (HCC) 09/11/2012  . Chronic obstructive pulmonary disease (HCC) 09/11/2012  . BP (high blood pressure) 09/11/2012    Past Surgical History  Procedure Laterality Date  . Back surgery      Current Outpatient Rx  Name  Route  Sig  Dispense  Refill  . albuterol (PROVENTIL HFA;VENTOLIN HFA) 108 (90 Base) MCG/ACT inhaler   Inhalation   Inhale 2 puffs into the lungs every 6 (six) hours as needed for wheezing or shortness of breath.         Marland Kitchen. alendronate (FOSAMAX) 70 MG tablet   Oral   Take 70 mg by mouth once a week. Take with a full glass of water on an empty stomach.         Marland Kitchen. aspirin 81 MG tablet   Oral   Take 81 mg by mouth daily.         Marland Kitchen. atenolol (TENORMIN) 25 MG tablet   Oral   Take 25 mg by mouth daily.         . Calcium Carbonate-Vitamin D (CALCIUM 600+D) 600-400 MG-UNIT tablet   Oral   Take 1 tablet by mouth 2 (two) times daily.          . ciprofloxacin (CIPRO) 500 MG tablet   Oral   Take 1 tablet (500 mg total) by mouth 2 (two) times daily.   21 tablet   0   . Fluticasone-Salmeterol (ADVAIR) 250-50 MCG/DOSE AEPB   Inhalation   Inhale 1 puff into the lungs 2 (two) times daily.         Marland Kitchen. galantamine (RAZADYNE) 8 MG tablet   Oral   Take 8 mg by mouth 2 (two) times daily.         . predniSONE (DELTASONE) 20 MG tablet   Oral   Take 2 tablets (40 mg total) by mouth daily.   8 tablet   0   . tiotropium (SPIRIVA) 18 MCG inhalation capsule   Inhalation   Place 18 mcg into inhaler and inhale daily.           Allergies Review of patient's allergies indicates no known allergies.  History reviewed. No pertinent family history.  Social History Social History  Substance Use Topics  . Smoking status: Never Smoker   . Smokeless tobacco: None  . Alcohol Use: No    Review of Systems Constitutional: Negative for fever. Cardiovascular: Negative for chest pain.  Respiratory: Negative for shortness of breath. Gastrointestinal: Negative for abdominal pain Genitourinary: Negative for dysuria. Musculoskeletal: Positive for back pain. Neurological: Negative for headache 10-point ROS otherwise negative.  ____________________________________________   PHYSICAL EXAM:  VITAL SIGNS: ED Triage Vitals  Enc Vitals Group     BP 11/06/15 1748 168/84 mmHg     Pulse Rate 11/06/15 1748 99     Resp 11/06/15 1748 20     Temp 11/06/15 1748 99.3 F (37.4 C)     Temp Source 11/06/15 1748 Oral     SpO2 11/06/15 1748 95 %     Weight 11/06/15 1748 130 lb 15.3 oz (59.4 kg)     Height 11/06/15 1748  (1.626 m)     Head Cir --      Peak Flow --      Pain Score 11/06/15 1749 8     Pain Loc --      Pain Edu? --      Excl. in GC? --     Constitutional: Alert and oriented. Well appearing and in no distress. Eyes: Normal exam ENT   Head: Normocephalic and atraumatic.   Mouth/Throat: Mucous membranes are  moist. Cardiovascular: Normal rate, regular rhythm. No murmur Respiratory: Normal respiratory effort without tachypnea nor retractions. Breath sounds are clear. Mild left lateral rib tenderness to palpation. Gastrointestinal: Soft and nontender. No distention.   Musculoskeletal: Normal range of motion in all extremities, normal range of motion in bilateral hips without pain. Patient does have moderate tenderness palpation throughout her entire thoracic and lumbar spine. No C-spine tenderness to palpation. Neurologic:  Normal speech and language. No gross focal neurologic deficits Skin:  Skin is warm, dry and intact.  Psychiatric: Mood and affect are normal.  ____________________________________________   RADIOLOGY  CT scans are consistent multiple rib fractures.  ____________________________________________    INITIAL IMPRESSION / ASSESSMENT AND PLAN / ED COURSE  Pertinent labs & imaging results that were available during my care of the patient were reviewed by me and considered in my medical decision making (see chart for details).  The patient presents to the emergency department with back pain and left lateral rib pain after a fall. Patient does not know why she fell. Denies LOC. Does not believe she hit her head. Patient does have tenderness throughout her entire thoracic and lumbar spine, no obvious deformity or ecchymosis noted. We'll obtain CT imaging as well as a chest x-ray of the chest and pelvis help further evaluate. I discussed this plan. The patient is agreeable.  Patient continued to complain of significant pain. CT scans are consistent multiple rib fractures. The CBC count significantly elevated. Patient states moderate pain relief after fentanyl, but the pain has returned. We'll dose morphine, given the amount of fractures, and the patient's comorbidities we will admit to the hospital for pain control and incentive spirometry. Patient agreeable to  plan.  ____________________________________________   FINAL CLINICAL IMPRESSION(S) / ED DIAGNOSES  Fall  Multiple rib fractures   Minna Antis, MD 11/06/15 2141

## 2015-11-06 NOTE — H&P (Addendum)
[employer] [facility] [place] [facility]   PATIENT NAME: Katherine Bartlett    MR#:  [id number]  DATE OF BIRTH:  30-Aug-1930  DATE OF ADMISSION:  11/06/2015  PRIMARY CARE PHYSICIAN: No primary care provider on file.   REQUESTING/REFERRING PHYSICIAN: Minna Antis, MD  CHIEF COMPLAINT:   Chief Complaint  Patient presents with  . Fall    HISTORY OF PRESENT ILLNESS:  Katherine Bartlett  is a 82 y.o. female with a known history of past medical history of dementia, hypertension, COPD who presents to the emergency department after a fall.According to the patient she was at home getting ready when she fell landing on her back and left side. Patient's reports significant back pain from her upper back down into her pelvis. Is not sure if she hit her head. Denies LOC. Denies vomiting. Denies headache. Patient presented to the emergency department via EMS. I'm unable to reach her daughter at either # listed on computer.  PAST MEDICAL HISTORY:   Past Medical History  Diagnosis Date  . Dementia   . Osteoporosis   . Hypertension   . COPD (chronic obstructive pulmonary disease) (HCC)    PAST SURGICAL HISTORY:   Past Surgical History  Procedure Laterality Date  . Back surgery      SOCIAL HISTORY:   Social History  Substance Use Topics  . Smoking status: Never Smoker   . Smokeless tobacco: Not on file  . Alcohol Use: No    FAMILY HISTORY:  History reviewed. No pertinent family history.  DRUG ALLERGIES:  No Known Allergies  REVIEW OF SYSTEMS:   Review of Systems  Constitutional: Negative for fever, weight loss, malaise/fatigue and diaphoresis.  HENT: Negative for ear discharge, ear pain, hearing loss, nosebleeds, sore throat and tinnitus.   Eyes: Negative for blurred vision and pain.  Respiratory: Negative for cough, hemoptysis, shortness of breath and wheezing.   Cardiovascular: Negative for chest pain, palpitations, orthopnea and leg swelling.    Gastrointestinal: Negative for heartburn, nausea, vomiting, abdominal pain, diarrhea, constipation and blood in stool.  Genitourinary: Negative for dysuria, urgency and frequency.  Musculoskeletal: Positive for myalgias, back pain, joint pain and falls.  Skin: Negative for itching and rash.  Neurological: Negative for dizziness, tingling, tremors, focal weakness, seizures, weakness and headaches.  Psychiatric/Behavioral: Positive for memory loss. Negative for depression. The patient is not nervous/anxious.    MEDICATIONS AT HOME:   Prior to Admission medications   Medication Sig Start Date End Date Taking? Authorizing Provider  albuterol (PROVENTIL HFA;VENTOLIN HFA) 108 (90 Base) MCG/ACT inhaler Inhale 2 puffs into the lungs every 6 (six) hours as needed for wheezing or shortness of breath.   Yes Historical Provider, MD  alendronate (FOSAMAX) 70 MG tablet Take 70 mg by mouth once a week. Take with a full glass of water on an empty stomach.   Yes Historical Provider, MD  aspirin 81 MG tablet Take 81 mg by mouth daily.   Yes Historical Provider, MD  atenolol (TENORMIN) 25 MG tablet Take 25 mg by mouth daily.   Yes Historical Provider, MD  Calcium Carbonate-Vitamin D (CALCIUM 600+D) 600-400 MG-UNIT tablet Take 1 tablet by mouth 2 (two) times daily.   Yes Historical Provider, MD  Fluticasone-Salmeterol (ADVAIR) 250-50 MCG/DOSE AEPB Inhale 1 puff into the lungs 2 (two) times daily.   Yes Historical Provider, MD  galantamine (RAZADYNE) 8 MG tablet Take 8 mg by mouth 2 (two) times daily.   Yes Historical Provider, MD  tiotropium [facility])  18 MCG inhalation capsule Place 18 mcg into inhaler and inhale daily.   Yes Historical Provider, MD  ciprofloxacin (CIPRO) 500 MG tablet Take 1 tablet (500 mg total) by mouth 2 (two) times daily. Patient not taking: Reported on 11/06/2015 07/17/15   Adrian Saran, MD  predniSONE (DELTASONE) 20 MG tablet Take 2 tablets (40 mg total) by mouth daily. Patient not taking:  Reported on 11/06/2015 10/14/15   Phineas Semen, MD      VITAL SIGNS:  Blood pressure 169/81, pulse 89, temperature 99.3 F (37.4 C), temperature source Oral, resp. rate 20, height  (1.626 m), weight 59.4 kg (130 lb 15.3 oz), SpO2 97 %. PHYSICAL EXAMINATION:  Physical Exam  Constitutional: She is well-developed, well-nourished, and in no distress.  HENT:  Head: Normocephalic and atraumatic.  Eyes: Conjunctivae and EOM are normal. Pupils are equal, round, and reactive to light.  Neck: Normal range of motion. Neck supple. No tracheal deviation present. No thyromegaly present.  Cardiovascular: Normal rate, regular rhythm and normal heart sounds.   Pulmonary/Chest: Effort normal and breath sounds normal. No respiratory distress. She has no wheezes. She exhibits tenderness and bony tenderness.  Lower chest wall tenderness  Abdominal: Soft. Bowel sounds are normal. She exhibits no distension. There is no tenderness.  Musculoskeletal: Normal range of motion.       Thoracic back: She exhibits tenderness, bony tenderness, pain and spasm.       Lumbar back: She exhibits tenderness, bony tenderness and spasm.  Neurological: She is alert. She is disoriented. No cranial nerve deficit. She has an abnormal Straight Leg Raise Test.  She could not raise her leg beyond 20 - 30  Unable to check gait  Skin: Skin is warm and dry. No rash noted.  Psychiatric: Mood and affect normal.  Demented   LABORATORY PANEL:   CBC  Recent Labs Lab 11/06/15 2021  WBC 19.4*  HGB 13.0  HCT 39.5  PLT 297   ------------------------------------------------------------------------------------------------------------------  Chemistries   Recent Labs Lab 11/06/15 1950  NA 137  K 3.6  CL 108  CO2 24  GLUCOSE 94  BUN 9  CREATININE 0.78  CALCIUM 9.3  AST 21  ALT 12*  ALKPHOS 67  BILITOT 0.2*    RADIOLOGY:  Dg Chest 1 View  11/06/2015  CLINICAL DATA:  Status post fall today.  Pain. EXAM: CHEST  1 VIEW COMPARISON:  Single-view of the chest 10/14/2015 and 07/14/2015. FINDINGS: The lungs are clear. No pneumothorax or pleural effusion. Heart size is upper normal. The patient is status post cervical fusion. Degenerative disease left shoulder is noted. IMPRESSION: No acute disease. Electronically Signed   By: Drusilla Kanner M.D.   On: 11/06/2015 19:14   Dg Pelvis 1-2 Views  11/06/2015  CLINICAL DATA:  Left-sided pain after a fall today. Initial encounter. EXAM: PELVIS - 1-2 VIEW COMPARISON:  CT abdomen and pelvis and single view of the tuft pelvis 09/06/2015. FINDINGS: No acute bony or joint abnormality is identified. Left worse than right hip degenerative change is seen. Calcification centrally in the pelvis is consistent with a urinary bladder stone as seen on the prior examinations. IMPRESSION: No acute abnormality. Left worse than right hip osteoarthritis. Urinary bladder stone is seen on prior studies. Electronically Signed   By: Drusilla Kanner M.D.   On: 11/06/2015 19:13   Ct Head Wo Contrast  11/06/2015  CLINICAL DATA:  Status post fall today. Altered mental status. Initial encounter. EXAM: CT HEAD WITHOUT CONTRAST CT CERVICAL SPINE  WITHOUT CONTRAST TECHNIQUE: Multidetector CT imaging of the head and cervical spine was performed following the standard protocol without intravenous contrast. Multiplanar CT image reconstructions of the cervical spine were also generated. COMPARISON:  Head and cervical spine CT scans 09/06/2015. FINDINGS: CT HEAD FINDINGS There is extensive chronic microvascular ischemic change. Cortical atrophy is identified. No acute abnormality including hemorrhage, infarct, mass lesion, mass effect, midline shift or abnormal extra-axial fluid collection. No hydrocephalus or pneumocephalus. Right maxillary sinus mucosal thickening is improved. Imaged left maxillary sinus is now clear. There is some ethmoid air cell disease. The calvarium is intact. CT CERVICAL SPINE FINDINGS The  patient is status post C6-7 ACDF and posterior fusion from C4-T3. No acute fracture is identified. Trace anterolisthesis C4 on C5 is noted and unchanged. Multilevel uncovertebral disease is seen. Lung apices demonstrate some apical scar. IMPRESSION: No acute abnormality head or cervical spine. Atrophy and extensive chronic microvascular ischemic change. Improved appearance of bilateral maxillary sinus disease. Status post C6-7 ACDF and posterior fusion C4-T3. Electronically Signed   By: Drusilla Kannerhomas  Dalessio M.D.   On: 11/06/2015 19:39   Ct Cervical Spine Wo Contrast  11/06/2015  CLINICAL DATA:  Status post fall today. Altered mental status. Initial encounter. EXAM: CT HEAD WITHOUT CONTRAST CT CERVICAL SPINE WITHOUT CONTRAST TECHNIQUE: Multidetector CT imaging of the head and cervical spine was performed following the standard protocol without intravenous contrast. Multiplanar CT image reconstructions of the cervical spine were also generated. COMPARISON:  Head and cervical spine CT scans 09/06/2015. FINDINGS: CT HEAD FINDINGS There is extensive chronic microvascular ischemic change. Cortical atrophy is identified. No acute abnormality including hemorrhage, infarct, mass lesion, mass effect, midline shift or abnormal extra-axial fluid collection. No hydrocephalus or pneumocephalus. Right maxillary sinus mucosal thickening is improved. Imaged left maxillary sinus is now clear. There is some ethmoid air cell disease. The calvarium is intact. CT CERVICAL SPINE FINDINGS The patient is status post C6-7 ACDF and posterior fusion from C4-T3. No acute fracture is identified. Trace anterolisthesis C4 on C5 is noted and unchanged. Multilevel uncovertebral disease is seen. Lung apices demonstrate some apical scar. IMPRESSION: No acute abnormality head or cervical spine. Atrophy and extensive chronic microvascular ischemic change. Improved appearance of bilateral maxillary sinus disease. Status post C6-7 ACDF and posterior fusion  C4-T3. Electronically Signed   By: Drusilla Kannerhomas  Dalessio M.D.   On: 11/06/2015 19:39   Ct Thoracic Spine Wo Contrast  11/06/2015  CLINICAL DATA:  Fall today with back pain.  Altered mental status. EXAM: CT THORACIC AND LUMBAR SPINE WITHOUT CONTRAST TECHNIQUE: Multidetector CT imaging of the thoracic and lumbar spine was performed without contrast. Multiplanar CT image reconstructions were also generated. COMPARISON:  CT chest 09/10/2012 and CT abdomen/ pelvis 09/06/2015, lumbar spine films 09/06/2015. FINDINGS: CT THORACIC SPINE FINDINGS Partially visualized anterior fusion hardware over the lower cervical spine and posterior fusion hardware over the cervical thoracic spine extending to the T3 level is intact. Vertebral body alignment is within normal. There is mild spondylosis throughout the thoracic spine. Disc spaces are within normal. There is a severe T12 compression fracture stable. Minimal retropulsion of the superior aspect of the T12 compression fracture causing mild impression upon the anterior thecal sac but no significant canal stenosis or cord compression. Mild T11 compression fracture without significant change. Possible mild compression along the superior endplate of T10 age indeterminate. Subtle loss of mid to anterior vertebral body height of T3 unchanged. Subacute fractures of the left posterior tenth, eleventh and twelfth ribs. Acute to subacute  fracture of the right posterior seventh rib as well as the right posterior fourth and fifth ribs and possibly third. Mild adjacent atelectatic change. No pneumothorax. CT LUMBAR SPINE FINDINGS Lumbar spine demonstrates normal vertebral body alignment, heights and disc space heights. There is mild spondylosis throughout the lumbar spine. There is a fracture involving the mid to superior aspect of the L2 vertebral body without significant loss of vertebral body height. No free fragments within the canal. No significant retropulsion. This finding is new since  09/06/2015. Axial images demonstrate mild broad-based disc bulge from the L2-3 level to the L5-S1 level worse at the L4-5 and L5-S1 levels. No focal disc herniation. No significant canal stenosis. Mild left-sided neural foraminal narrowing at the L3-4 level and mild bilateral neural foraminal narrowing at the L4-5. Moderate facet arthropathy at the L4-5 and L5-S1 levels. IMPRESSION: CT THORACIC SPINE IMPRESSION Severe compression fracture of T12 with mild retropulsion of the fracture unchanged with mild T11 compression deformity stable since 09/06/2015. Possible compression of the superior endplate of T11 likely new since 09/06/2015. Mild spondylosis throughout the thoracic spine. Fusion hardware over the upper thoracic spine stable. Stable mild loss of mid to anterior vertebral body of T3. Findings suggesting acute to subacute fractures of the right third, fourth, fifth and seventh ribs with subacute fractures of the left posterior tenth through twelfth ribs. CT LUMBAR SPINE IMPRESSION Acute fracture of the mid to superior aspect of the L2 vertebral body without significant loss of vertebral body height as this is new since 09/06/2015. No retropulsion or free fragments in the canal. Mild spondylosis throughout the lumbar spine with multilevel disc disease worse at the L4-5 and L5-S1 levels. No focal disc herniation. Neural foraminal narrowing at several levels as described Electronically Signed   By: Elberta Fortis M.D.   On: 11/06/2015 19:58   Ct Lumbar Spine Wo Contrast  11/06/2015  CLINICAL DATA:  Fall today with back pain.  Altered mental status. EXAM: CT THORACIC AND LUMBAR SPINE WITHOUT CONTRAST TECHNIQUE: Multidetector CT imaging of the thoracic and lumbar spine was performed without contrast. Multiplanar CT image reconstructions were also generated. COMPARISON:  CT chest 09/10/2012 and CT abdomen/ pelvis 09/06/2015, lumbar spine films 09/06/2015. FINDINGS: CT THORACIC SPINE FINDINGS Partially visualized  anterior fusion hardware over the lower cervical spine and posterior fusion hardware over the cervical thoracic spine extending to the T3 level is intact. Vertebral body alignment is within normal. There is mild spondylosis throughout the thoracic spine. Disc spaces are within normal. There is a severe T12 compression fracture stable. Minimal retropulsion of the superior aspect of the T12 compression fracture causing mild impression upon the anterior thecal sac but no significant canal stenosis or cord compression. Mild T11 compression fracture without significant change. Possible mild compression along the superior endplate of T10 age indeterminate. Subtle loss of mid to anterior vertebral body height of T3 unchanged. Subacute fractures of the left posterior tenth, eleventh and twelfth ribs. Acute to subacute fracture of the right posterior seventh rib as well as the right posterior fourth and fifth ribs and possibly third. Mild adjacent atelectatic change. No pneumothorax. CT LUMBAR SPINE FINDINGS Lumbar spine demonstrates normal vertebral body alignment, heights and disc space heights. There is mild spondylosis throughout the lumbar spine. There is a fracture involving the mid to superior aspect of the L2 vertebral body without significant loss of vertebral body height. No free fragments within the canal. No significant retropulsion. This finding is new since 09/06/2015. Axial images demonstrate mild  broad-based disc bulge from the L2-3 level to the L5-S1 level worse at the L4-5 and L5-S1 levels. No focal disc herniation. No significant canal stenosis. Mild left-sided neural foraminal narrowing at the L3-4 level and mild bilateral neural foraminal narrowing at the L4-5. Moderate facet arthropathy at the L4-5 and L5-S1 levels. IMPRESSION: CT THORACIC SPINE IMPRESSION Severe compression fracture of T12 with mild retropulsion of the fracture unchanged with mild T11 compression deformity stable since 09/06/2015.  Possible compression of the superior endplate of T11 likely new since 09/06/2015. Mild spondylosis throughout the thoracic spine. Fusion hardware over the upper thoracic spine stable. Stable mild loss of mid to anterior vertebral body of T3. Findings suggesting acute to subacute fractures of the right third, fourth, fifth and seventh ribs with subacute fractures of the left posterior tenth through twelfth ribs. CT LUMBAR SPINE IMPRESSION Acute fracture of the mid to superior aspect of the L2 vertebral body without significant loss of vertebral body height as this is new since 09/06/2015. No retropulsion or free fragments in the canal. Mild spondylosis throughout the lumbar spine with multilevel disc disease worse at the L4-5 and L5-S1 levels. No focal disc herniation. Neural foraminal narrowing at several levels as described Electronically Signed   By: Elberta Fortis M.D.   On: 11/06/2015 19:58   IMPRESSION AND PLAN:  82 year old female with a known history of dementia, hypertension, COPD is being admitted status post fall  * Multiple rib fractures: Being admitted for pain control - She does not have any pneumothorax on films  * Thoracic and lumbar vertebral fracture - T11 and T12 fracture seems old - New L2 compression fracture without significant loss of vertebral body height - No spinal cord compression - We will consult orthopedics for further evaluation  * COPD - Stable - Continue home inhalers  * Hypertension - Continue atenolol  * Recurrent fall - Physical and occupational therapy consult - May need placement    All the records are reviewed and case discussed with ED provider. Management plans discussed with the patient, family and they are in agreement.  CODE STATUS: FULL CODE  I tried to which patient's daughter at both listed numbers in the computer, but was unsuccessful.  We will consult palliative care for goals of care discussion.  TOTAL TIME TAKING CARE OF THIS PATIENT:  45 minutes.    [facility], Katherine Bartlett M.D on 11/06/2015 at 11:06 PM  Between 7am to 6pm - Pager - [phone]  After 6pm go to [email or web address] - Scientist, research (life sciences) [facility]  Office  [phone]  CC: Primary care physician; No primary care provider on file.   Note: This dictation was prepared with Dragon dictation along with smaller phrase technology. Any transcriptional errors that result from this process are unintentional.

## 2015-11-06 NOTE — ED Notes (Signed)
Report received on pt. Care assumed. Pt currently in radiology for xrays and CTs.

## 2015-11-06 NOTE — ED Notes (Addendum)
Patient being seen for fall. Patient states that she is unsure how she fell but denies hitting her head. Patient in no acute distress at this time. Patient c/o back pain and bilateral hip pain at this time

## 2015-11-07 LAB — BASIC METABOLIC PANEL
Anion gap: 8 (ref 5–15)
BUN: 18 mg/dL (ref 6–20)
CHLORIDE: 106 mmol/L (ref 101–111)
CO2: 25 mmol/L (ref 22–32)
CREATININE: 0.6 mg/dL (ref 0.44–1.00)
Calcium: 8.4 mg/dL — ABNORMAL LOW (ref 8.9–10.3)
GFR calc Af Amer: >60 mL/min (ref >60)
GFR calc non Af Amer: >60 mL/min (ref >60)
GLUCOSE: 143 mg/dL — AB (ref 65–99)
Potassium: 3.5 mmol/L (ref 3.5–5.1)
Sodium: 139 mmol/L (ref 135–145)

## 2015-11-07 LAB — CBC
HCT: 36.6 % (ref 35.0–47.0)
Hemoglobin: 12.4 g/dL (ref 12.0–16.0)
MCH: 29.3 pg (ref 26.0–34.0)
MCHC: 34 g/dL (ref 32.0–36.0)
MCV: 86.1 fL (ref 80.0–100.0)
PLATELETS: 256 10*3/uL (ref 150–440)
RBC: 4.25 MIL/uL (ref 3.80–5.20)
RDW: 14.6 % — AB (ref 11.5–14.5)
WBC: 10.7 10*3/uL (ref 3.6–11.0)

## 2015-11-07 LAB — GLUCOSE, CAPILLARY: Glucose-Capillary: 112 mg/dL — ABNORMAL HIGH (ref 65–99)

## 2015-11-07 MED ORDER — MORPHINE SULFATE (PF) 2 MG/ML IV SOLN
2.0000 mg | INTRAVENOUS | Status: DC | PRN
  Administered 2015-11-07: 1 mg via INTRAVENOUS
  Administered 2015-11-07 – 2015-11-09 (×4): 2 mg via INTRAVENOUS
  Filled 2015-11-07 (×5): qty 1

## 2015-11-07 MED ORDER — HYDROCODONE-ACETAMINOPHEN 5-325 MG PO TABS
1.0000 | ORAL_TABLET | ORAL | Status: DC | PRN
  Administered 2015-11-07 (×2): 1 via ORAL
  Filled 2015-11-07 (×2): qty 1

## 2015-11-07 MED ORDER — CALCIUM CARBONATE-VITAMIN D 500-200 MG-UNIT PO TABS
1.0000 | ORAL_TABLET | Freq: Two times a day (BID) | ORAL | Status: DC
  Administered 2015-11-07 – 2015-11-09 (×5): 1 via ORAL
  Filled 2015-11-07 (×5): qty 1

## 2015-11-07 MED ORDER — ATENOLOL 25 MG PO TABS
25.0000 mg | ORAL_TABLET | Freq: Every day | ORAL | Status: DC
  Administered 2015-11-07 – 2015-11-08 (×2): 25 mg via ORAL
  Filled 2015-11-07 (×2): qty 1

## 2015-11-07 MED ORDER — HEPARIN SODIUM (PORCINE) 5000 UNIT/ML IJ SOLN
5000.0000 [IU] | Freq: Three times a day (TID) | INTRAMUSCULAR | Status: DC
  Administered 2015-11-07 – 2015-11-09 (×7): 5000 [IU] via SUBCUTANEOUS
  Filled 2015-11-07 (×7): qty 1

## 2015-11-07 MED ORDER — ACETAMINOPHEN 325 MG PO TABS
650.0000 mg | ORAL_TABLET | Freq: Four times a day (QID) | ORAL | Status: DC | PRN
  Administered 2015-11-07 – 2015-11-08 (×3): 325 mg via ORAL
  Filled 2015-11-07: qty 2
  Filled 2015-11-07 (×2): qty 1

## 2015-11-07 MED ORDER — ASPIRIN 81 MG PO CHEW
81.0000 mg | CHEWABLE_TABLET | Freq: Every day | ORAL | Status: DC
  Administered 2015-11-07 – 2015-11-09 (×3): 81 mg via ORAL
  Filled 2015-11-07 (×3): qty 1

## 2015-11-07 MED ORDER — ACETAMINOPHEN 650 MG RE SUPP: 650.0000 mg | Freq: Four times a day (QID) | RECTAL | Status: DC | PRN

## 2015-11-07 MED ORDER — HYDRALAZINE HCL 20 MG/ML IJ SOLN: 10.0000 mg | INTRAMUSCULAR | Status: DC | PRN

## 2015-11-07 MED ORDER — ONDANSETRON HCL 4 MG PO TABS: 4.0000 mg | ORAL_TABLET | Freq: Four times a day (QID) | ORAL | Status: DC | PRN

## 2015-11-07 MED ORDER — SODIUM CHLORIDE 0.9 % IV SOLN
INTRAVENOUS | Status: DC
  Administered 2015-11-07: 16:00:00 via INTRAVENOUS
  Administered 2015-11-07: 75 mL/h via INTRAVENOUS
  Administered 2015-11-08 (×2): via INTRAVENOUS

## 2015-11-07 MED ORDER — TRAZODONE HCL 50 MG PO TABS
25.0000 mg | ORAL_TABLET | Freq: Every evening | ORAL | Status: DC | PRN
  Administered 2015-11-07: 25 mg via ORAL
  Filled 2015-11-07: qty 1

## 2015-11-07 MED ORDER — DOCUSATE SODIUM 100 MG PO CAPS
100.0000 mg | ORAL_CAPSULE | Freq: Two times a day (BID) | ORAL | Status: DC
  Administered 2015-11-07 – 2015-11-09 (×6): 100 mg via ORAL
  Filled 2015-11-07 (×6): qty 1

## 2015-11-07 MED ORDER — HYDROCODONE-ACETAMINOPHEN 5-325 MG PO TABS
1.0000 | ORAL_TABLET | ORAL | Status: DC | PRN
  Administered 2015-11-07 – 2015-11-09 (×4): 1 via ORAL
  Filled 2015-11-07 (×4): qty 1

## 2015-11-07 MED ORDER — TIOTROPIUM BROMIDE MONOHYDRATE 18 MCG IN CAPS
18.0000 ug | ORAL_CAPSULE | Freq: Every day | RESPIRATORY_TRACT | Status: DC
  Administered 2015-11-07 – 2015-11-09 (×3): 18 ug via RESPIRATORY_TRACT
  Filled 2015-11-07: qty 5

## 2015-11-07 MED ORDER — ALBUTEROL SULFATE (2.5 MG/3ML) 0.083% IN NEBU: 3.0000 mL | INHALATION_SOLUTION | Freq: Four times a day (QID) | RESPIRATORY_TRACT | Status: DC | PRN

## 2015-11-07 MED ORDER — HYDROMORPHONE HCL 1 MG/ML IJ SOLN: 1.0000 mg | INTRAMUSCULAR | Status: DC | PRN

## 2015-11-07 MED ORDER — BISACODYL 5 MG PO TBEC: 5.0000 mg | DELAYED_RELEASE_TABLET | Freq: Every day | ORAL | Status: DC | PRN

## 2015-11-07 MED ORDER — MOMETASONE FURO-FORMOTEROL FUM 200-5 MCG/ACT IN AERO
2.0000 | INHALATION_SPRAY | Freq: Two times a day (BID) | RESPIRATORY_TRACT | Status: DC
  Administered 2015-11-07 – 2015-11-09 (×6): 2 via RESPIRATORY_TRACT
  Filled 2015-11-07: qty 8.8

## 2015-11-07 MED ORDER — GALANTAMINE HYDROBROMIDE 4 MG PO TABS
8.0000 mg | ORAL_TABLET | Freq: Two times a day (BID) | ORAL | Status: DC
  Administered 2015-11-07 – 2015-11-09 (×6): 8 mg via ORAL
  Filled 2015-11-07 (×9): qty 2

## 2015-11-07 MED ORDER — ONDANSETRON HCL 4 MG/2ML IJ SOLN: 4.0000 mg | Freq: Four times a day (QID) | INTRAMUSCULAR | Status: DC | PRN

## 2015-11-07 NOTE — Evaluation (Signed)
Occupational Therapy Evaluation Patient Details Name: Katherine Bartlett MRN: [id number] DOB: 09-Jul-1930 Today's Date: 11/07/2015    History of Present Illness Katherine Bartlett is a 82 y.o. female with a known history of past medical history of dementia, hypertension, COPD who presents to the emergency department after a fall. pt. presents with  back and left side pain, as well as Right shoulder pain. Pt with old T11-T12 fracture and New L2 compression fracture without significant loss of vertebral body height per x-rays.  Ortho consult pending.   Clinical Impression   Pt. Is an 82 y.o. Female who was admitted to [facility] following a fall. Pt. Presents with increased pain in her back, left side, and right shoulder. Pt. Also presents with limited activity tolerance, decreased functional mobility for ADLs and impaired cognition which hinder her ability to complete ADL and IADL tasks. Pt. Could benefit from skilled IT services for ADL and A/E training, UE therapeutic ex, and functional mobility for ADLs to improve ADL functioning and return to her PLOF.    Follow Up Recommendations  SNF    Equipment Recommendations       Recommendations for Other Services       Precautions / Restrictions Precautions Precautions: Fall;Back Required Braces or Orthoses:  (none currently, awaiting ortho consult) Restrictions Weight Bearing Restrictions: No      Mobility Bed Mobility Overal bed mobility: Needs Assistance (Total assist with attempts for movement secondary to pain.)          Transfers                     Balance Overall balance assessment:  (unable to assess)                                          ADL Overall ADL's : Needs assistance/impaired Eating/Feeding: Set up;Moderate assistance (Positioning secondary to pain.)   Grooming: Set up;Moderate assistance               Lower Body Dressing: Total assistance               Functional  mobility during ADLs: Total assistance (secondary to pain)       Vision     Perception     Praxis      Pertinent Vitals/Pain Pain Assessment: 0-10 Pain Score:  (Increased pain with attempts for movement. Pt. unable to rate pain scale.) Pain Location: Back, hip, and right shoulder Pain Descriptors / Indicators: Aching;Crying;Grimacing;Guarding Pain Intervention(s): Limited activity within patient's tolerance     Hand Dominance Right   Extremity/Trunk Assessment Upper Extremity Assessment Upper Extremity Assessment: Generalized weakness (difficult to assess secondary to cognition, and pain in right shoulder)         Communication Communication Communication: No difficulties   Cognition Arousal/Alertness: Awake/alert Behavior During Therapy: WFL for tasks assessed/performed Overall Cognitive Status: History of cognitive impairments - at baseline       Memory: Decreased short-term memory             General Comments       Exercises       Shoulder Instructions      Home Living Family/patient expects to be discharged to:: Private residence Living Arrangements: Children Available Help at Discharge: Family;Available 24 hours/day Type of Home: House Home Access: Stairs to enter [employer] of Steps: 3   Home Layout: One level  Bathroom Shower/Tub: Tub/shower unit;Door Shower/tub characteristics: Door       Home Equipment: Walker - 2 wheels   Additional Comments: Pt with limited recall of home situation, information obtained from chart.      Prior Functioning/Environment Level of Independence: Independent with assistive device(s)        Comments: ambulated using rw at all time, mostly household distances    OT Diagnosis: Generalized weakness   OT Problem List: Decreased strength;Decreased range of motion;Pain;Decreased cognition;Decreased safety awareness;Impaired balance (sitting and/or standing);Impaired UE functional  use;Decreased coordination   OT Treatment/Interventions:      OT Goals(Current goals can be found in the care plan section) Acute Rehab OT Goals Patient Stated Goal: To have less pain.  OT Frequency:     Barriers to D/C:            Co-evaluation              End of Session    Activity Tolerance: Patient tolerated treatment well Patient left: in bed;with bed alarm set;with call bell/phone within reach   Time: [id number] OT Time Calculation (min): 20 min Charges:  OT General Charges $OT Visit: 1 Procedure OT Evaluation $OT Eval Moderate Complexity: 1 Procedure G-Codes:    Olegario MessierElaine Ebony Rickel, MS, OTR/L Olegario MessierElaine Janoah Menna 11/07/2015, 11:19 AM

## 2015-11-07 NOTE — Progress Notes (Signed)
[facility] [place] at [facility]   PATIENT NAME: Katherine Bartlett    MRN#:  [id number]  DATE OF BIRTH:  04-10-1931  SUBJECTIVE:  Hospital Day: 1 day Katherine Bartlett is a 82 y.o. female presenting with Fall .   Overnight events: No overnight events Interval Events: Continued complaints of pain mainly her back unable to localize, patient confused  REVIEW OF SYSTEMS:  Unable to fully obtain given patient's mental status-dementia  DRUG ALLERGIES:  No Known Allergies  VITALS:  Blood pressure 168/74, pulse 73, temperature 98.1 F (36.7 C), temperature source Oral, resp. rate 16, height 5\' 4"  (1.626 m), weight 130 lb 1.6 oz (59.013 kg), SpO2 95 %.  PHYSICAL EXAMINATION:  VITAL SIGNS: Filed Vitals:   11/07/15 0031 11/07/15 0723  BP: 175/72 168/74  Pulse: 79 73  Temp: 98.7 F (37.1 C) 98.1 F (36.7 C)  Resp: 18 16   GENERAL:82 y.o.female currently in Moderate acute distress. Given pain HEAD: Normocephalic, atraumatic.  EYES: Pupils equal, round, reactive to light. Extraocular muscles intact. No scleral icterus.  MOUTH: Moist mucosal membrane. Dentition intact. No abscess noted.  EAR, NOSE, THROAT: Clear without exudates. No external lesions.  NECK: Supple. No thyromegaly. No nodules. No JVD.  PULMONARY: Clear to ascultation, without wheeze rails or rhonci. No use of accessory muscles, Good respiratory effort. good air entry bilaterally CHEST: Nontender to palpation.  CARDIOVASCULAR: S1 and S2. Regular rate and rhythm. No murmurs, rubs, or gallops. No edema. Pedal pulses 2+ bilaterally.  GASTROINTESTINAL: Soft, nontender, nondistended. No masses. Positive bowel sounds. No hepatosplenomegaly.  MUSCULOSKELETAL: No swelling, clubbing, or edema. Range of motion full in all extremities.  NEUROLOGIC: Cranial nerves II through XII are intact. No gross focal neurological deficits. Sensation intact. Reflexes intact.  SKIN: No ulceration, lesions, rashes, or  cyanosis. Skin warm and dry. Turgor intact.  PSYCHIATRIC: Mood, affect anxious agitated The patient is awake, alert and oriented self Insight, judgment poor.      LABORATORY PANEL:   CBC  Recent Labs Lab 11/07/15 0335  WBC 10.7  HGB 12.4  HCT 36.6  PLT 256   ------------------------------------------------------------------------------------------------------------------  Chemistries   Recent Labs Lab 11/06/15 1950 11/07/15 0335  NA 137 139  K 3.6 3.5  CL 108 106  CO2 24 25  GLUCOSE 94 143*  BUN 9 18  CREATININE 0.78 0.60  CALCIUM 9.3 8.4*  AST 21  --   ALT 12*  --   ALKPHOS 67  --   BILITOT 0.2*  --    ------------------------------------------------------------------------------------------------------------------  Cardiac Enzymes No results for input(s): TROPONINI in the last 168 hours. ------------------------------------------------------------------------------------------------------------------  RADIOLOGY:  Dg Chest 1 View  11/06/2015  CLINICAL DATA:  Status post fall today.  Pain. EXAM: CHEST 1 VIEW COMPARISON:  Single-view of the chest 10/14/2015 and 07/14/2015. FINDINGS: The lungs are clear. No pneumothorax or pleural effusion. Heart size is upper normal. The patient is status post cervical fusion. Degenerative disease left shoulder is noted. IMPRESSION: No acute disease. Electronically Signed   By: Drusilla Kanner M.D.   On: 11/06/2015 19:14   Dg Pelvis 1-2 Views  11/06/2015  CLINICAL DATA:  Left-sided pain after a fall today. Initial encounter. EXAM: PELVIS - 1-2 VIEW COMPARISON:  CT abdomen and pelvis and single view of the tuft pelvis 09/06/2015. FINDINGS: No acute bony or joint abnormality is identified. Left worse than right hip degenerative change is seen. Calcification centrally in the pelvis is consistent with a urinary bladder stone as seen on  the prior examinations. IMPRESSION: No acute abnormality. Left worse than right hip osteoarthritis.  Urinary bladder stone is seen on prior studies. Electronically Signed   By: Drusilla Kanner M.D.   On: 11/06/2015 19:13   Ct Head Wo Contrast  11/06/2015  CLINICAL DATA:  Status post fall today. Altered mental status. Initial encounter. EXAM: CT HEAD WITHOUT CONTRAST CT CERVICAL SPINE WITHOUT CONTRAST TECHNIQUE: Multidetector CT imaging of the head and cervical spine was performed following the standard protocol without intravenous contrast. Multiplanar CT image reconstructions of the cervical spine were also generated. COMPARISON:  Head and cervical spine CT scans 09/06/2015. FINDINGS: CT HEAD FINDINGS There is extensive chronic microvascular ischemic change. Cortical atrophy is identified. No acute abnormality including hemorrhage, infarct, mass lesion, mass effect, midline shift or abnormal extra-axial fluid collection. No hydrocephalus or pneumocephalus. Right maxillary sinus mucosal thickening is improved. Imaged left maxillary sinus is now clear. There is some ethmoid air cell disease. The calvarium is intact. CT CERVICAL SPINE FINDINGS The patient is status post C6-7 ACDF and posterior fusion from C4-T3. No acute fracture is identified. Trace anterolisthesis C4 on C5 is noted and unchanged. Multilevel uncovertebral disease is seen. Lung apices demonstrate some apical scar. IMPRESSION: No acute abnormality head or cervical spine. Atrophy and extensive chronic microvascular ischemic change. Improved appearance of bilateral maxillary sinus disease. Status post C6-7 ACDF and posterior fusion C4-T3. Electronically Signed   By: Drusilla Kanner M.D.   On: 11/06/2015 19:39   Ct Cervical Spine Wo Contrast  11/06/2015  CLINICAL DATA:  Status post fall today. Altered mental status. Initial encounter. EXAM: CT HEAD WITHOUT CONTRAST CT CERVICAL SPINE WITHOUT CONTRAST TECHNIQUE: Multidetector CT imaging of the head and cervical spine was performed following the standard protocol without intravenous contrast.  Multiplanar CT image reconstructions of the cervical spine were also generated. COMPARISON:  Head and cervical spine CT scans 09/06/2015. FINDINGS: CT HEAD FINDINGS There is extensive chronic microvascular ischemic change. Cortical atrophy is identified. No acute abnormality including hemorrhage, infarct, mass lesion, mass effect, midline shift or abnormal extra-axial fluid collection. No hydrocephalus or pneumocephalus. Right maxillary sinus mucosal thickening is improved. Imaged left maxillary sinus is now clear. There is some ethmoid air cell disease. The calvarium is intact. CT CERVICAL SPINE FINDINGS The patient is status post C6-7 ACDF and posterior fusion from C4-T3. No acute fracture is identified. Trace anterolisthesis C4 on C5 is noted and unchanged. Multilevel uncovertebral disease is seen. Lung apices demonstrate some apical scar. IMPRESSION: No acute abnormality head or cervical spine. Atrophy and extensive chronic microvascular ischemic change. Improved appearance of bilateral maxillary sinus disease. Status post C6-7 ACDF and posterior fusion C4-T3. Electronically Signed   By: Drusilla Kanner M.D.   On: 11/06/2015 19:39   Ct Thoracic Spine Wo Contrast  11/06/2015  CLINICAL DATA:  Fall today with back pain.  Altered mental status. EXAM: CT THORACIC AND LUMBAR SPINE WITHOUT CONTRAST TECHNIQUE: Multidetector CT imaging of the thoracic and lumbar spine was performed without contrast. Multiplanar CT image reconstructions were also generated. COMPARISON:  CT chest 09/10/2012 and CT abdomen/ pelvis 09/06/2015, lumbar spine films 09/06/2015. FINDINGS: CT THORACIC SPINE FINDINGS Partially visualized anterior fusion hardware over the lower cervical spine and posterior fusion hardware over the cervical thoracic spine extending to the T3 level is intact. Vertebral body alignment is within normal. There is mild spondylosis throughout the thoracic spine. Disc spaces are within normal. There is a severe T12  compression fracture stable. Minimal retropulsion of the superior aspect  of the T12 compression fracture causing mild impression upon the anterior thecal sac but no significant canal stenosis or cord compression. Mild T11 compression fracture without significant change. Possible mild compression along the superior endplate of T10 age indeterminate. Subtle loss of mid to anterior vertebral body height of T3 unchanged. Subacute fractures of the left posterior tenth, eleventh and twelfth ribs. Acute to subacute fracture of the right posterior seventh rib as well as the right posterior fourth and fifth ribs and possibly third. Mild adjacent atelectatic change. No pneumothorax. CT LUMBAR SPINE FINDINGS Lumbar spine demonstrates normal vertebral body alignment, heights and disc space heights. There is mild spondylosis throughout the lumbar spine. There is a fracture involving the mid to superior aspect of the L2 vertebral body without significant loss of vertebral body height. No free fragments within the canal. No significant retropulsion. This finding is new since 09/06/2015. Axial images demonstrate mild broad-based disc bulge from the L2-3 level to the L5-S1 level worse at the L4-5 and L5-S1 levels. No focal disc herniation. No significant canal stenosis. Mild left-sided neural foraminal narrowing at the L3-4 level and mild bilateral neural foraminal narrowing at the L4-5. Moderate facet arthropathy at the L4-5 and L5-S1 levels. IMPRESSION: CT THORACIC SPINE IMPRESSION Severe compression fracture of T12 with mild retropulsion of the fracture unchanged with mild T11 compression deformity stable since 09/06/2015. Possible compression of the superior endplate of T11 likely new since 09/06/2015. Mild spondylosis throughout the thoracic spine. Fusion hardware over the upper thoracic spine stable. Stable mild loss of mid to anterior vertebral body of T3. Findings suggesting acute to subacute fractures of the right third,  fourth, fifth and seventh ribs with subacute fractures of the left posterior tenth through twelfth ribs. CT LUMBAR SPINE IMPRESSION Acute fracture of the mid to superior aspect of the L2 vertebral body without significant loss of vertebral body height as this is new since 09/06/2015. No retropulsion or free fragments in the canal. Mild spondylosis throughout the lumbar spine with multilevel disc disease worse at the L4-5 and L5-S1 levels. No focal disc herniation. Neural foraminal narrowing at several levels as described Electronically Signed   By: Elberta Fortisaniel  Boyle M.D.   On: 11/06/2015 19:58   Ct Lumbar Spine Wo Contrast  11/06/2015  CLINICAL DATA:  Fall today with back pain.  Altered mental status. EXAM: CT THORACIC AND LUMBAR SPINE WITHOUT CONTRAST TECHNIQUE: Multidetector CT imaging of the thoracic and lumbar spine was performed without contrast. Multiplanar CT image reconstructions were also generated. COMPARISON:  CT chest 09/10/2012 and CT abdomen/ pelvis 09/06/2015, lumbar spine films 09/06/2015. FINDINGS: CT THORACIC SPINE FINDINGS Partially visualized anterior fusion hardware over the lower cervical spine and posterior fusion hardware over the cervical thoracic spine extending to the T3 level is intact. Vertebral body alignment is within normal. There is mild spondylosis throughout the thoracic spine. Disc spaces are within normal. There is a severe T12 compression fracture stable. Minimal retropulsion of the superior aspect of the T12 compression fracture causing mild impression upon the anterior thecal sac but no significant canal stenosis or cord compression. Mild T11 compression fracture without significant change. Possible mild compression along the superior endplate of T10 age indeterminate. Subtle loss of mid to anterior vertebral body height of T3 unchanged. Subacute fractures of the left posterior tenth, eleventh and twelfth ribs. Acute to subacute fracture of the right posterior seventh rib as  well as the right posterior fourth and fifth ribs and possibly third. Mild adjacent atelectatic change. No pneumothorax.  CT LUMBAR SPINE FINDINGS Lumbar spine demonstrates normal vertebral body alignment, heights and disc space heights. There is mild spondylosis throughout the lumbar spine. There is a fracture involving the mid to superior aspect of the L2 vertebral body without significant loss of vertebral body height. No free fragments within the canal. No significant retropulsion. This finding is new since 09/06/2015. Axial images demonstrate mild broad-based disc bulge from the L2-3 level to the L5-S1 level worse at the L4-5 and L5-S1 levels. No focal disc herniation. No significant canal stenosis. Mild left-sided neural foraminal narrowing at the L3-4 level and mild bilateral neural foraminal narrowing at the L4-5. Moderate facet arthropathy at the L4-5 and L5-S1 levels. IMPRESSION: CT THORACIC SPINE IMPRESSION Severe compression fracture of T12 with mild retropulsion of the fracture unchanged with mild T11 compression deformity stable since 09/06/2015. Possible compression of the superior endplate of T11 likely new since 09/06/2015. Mild spondylosis throughout the thoracic spine. Fusion hardware over the upper thoracic spine stable. Stable mild loss of mid to anterior vertebral body of T3. Findings suggesting acute to subacute fractures of the right third, fourth, fifth and seventh ribs with subacute fractures of the left posterior tenth through twelfth ribs. CT LUMBAR SPINE IMPRESSION Acute fracture of the mid to superior aspect of the L2 vertebral body without significant loss of vertebral body height as this is new since 09/06/2015. No retropulsion or free fragments in the canal. Mild spondylosis throughout the lumbar spine with multilevel disc disease worse at the L4-5 and L5-S1 levels. No focal disc herniation. Neural foraminal narrowing at several levels as described Electronically Signed   By: Elberta Fortis M.D.   On: 11/06/2015 19:58    EKG:   Orders placed or performed during the hospital encounter of 10/14/15  . ED EKG  . ED EKG  . EKG 12-Lead  . EKG 12-Lead    ASSESSMENT AND PLAN:   Katherine Bartlett is a 82 y.o. female presenting with Fall . Admitted 11/06/2015 : Day #: 1 day Intractable pain secondary to multiple rib fractures as well as L2 compression fracture: Continue IV opioids for pain control, and bowel regiment, orthopedic input appreciated  2 COPD unspecified - Continue home inhalers  3 Hypertension essential - Continue atenolol, currently poorly controlled however still in pain obtain better pain control reassess blood pressure  4 Recurrent fall - Physical and occupational therapy consult check vitamin D level - May need placement   All the records are reviewed and case discussed with Care Management/Social Workerr. Management plans discussed with the patient, family and they are in agreement.  CODE STATUS: full TOTAL TIME TAKING CARE OF THIS PATIENT: 28 minutes.   POSSIBLE D/C IN 1-2DAYS, DEPENDING ON CLINICAL CONDITION.   Hower,  Mardi Mainland.D on 11/07/2015 at 1:45 PM  Between 7am to 6pm - Pager - [phone]  After 6pm: House Pager: - [phone]  [facility]  Office  [phone]  CC: Primary care physician; No primary care provider on file.

## 2015-11-07 NOTE — [place] FL2 (Signed)
[place] MEDICAID FL2 LEVEL OF CARE SCREENING TOOL     IDENTIFICATION  Patient Name: Katherine Bartlett Birthdate: 06-06-1930 Sex: female Admission Date (Current Location): 11/06/2015  [facility] and [place] Number:  [address]  ([id number]) Facility and Address:  [facility], [address] [place], [place] [address]      Provider Number: [id number]  Attending Physician Name and Address:  Wyatt Haste, MD  Relative Name and Phone Number:       Current Level of Care: Hospital Recommended Level of Care: Skilled Nursing Facility Prior Approval Number:    Date Approved/Denied:   PASRR Number:  ( [id number] )  Discharge Plan: SNF    Current Diagnoses: Patient Active Problem List   Diagnosis Date Noted  . Rib fracture 11/06/2015  . UTI (lower urinary tract infection) 07/14/2015  . Fracture of cervical vertebra (HCC) 09/11/2012  . Chronic obstructive pulmonary disease (HCC) 09/11/2012  . BP (high blood pressure) 09/11/2012    Orientation RESPIRATION BLADDER Height & Weight     Self, Place  Normal Incontinent Weight: 130 lb 1.6 oz (59.013 kg) Height:   (162.6 cm)  BEHAVIORAL SYMPTOMS/MOOD NEUROLOGICAL BOWEL NUTRITION STATUS   (none )  (none ) Continent Diet (Diet: Regular )  AMBULATORY STATUS COMMUNICATION OF NEEDS Skin   Extensive Assist Verbally Skin abrasions, Other (Comment) (Laceration Right Elbow )                       Personal Care Assistance Level of Assistance  Bathing, Feeding, Dressing Bathing Assistance: Limited assistance Feeding assistance: Independent Dressing Assistance: Limited assistance     Functional Limitations Info  Sight, Hearing, Speech Sight Info: Adequate Hearing Info: Impaired Speech Info: Adequate    SPECIAL CARE FACTORS FREQUENCY  PT (By licensed PT), OT (By licensed OT)     PT Frequency:  (5) OT Frequency:  (5)            Contractures      Additional Factors Info  Code Status,  Allergies Code Status Info:  (Full Code. ) Allergies Info:  (No Know Allergies. )           Current Medications (11/07/2015):  This is the current hospital active medication list Current Facility-Administered Medications  Medication Dose Route Frequency Provider Last Rate Last Dose  . 0.9 %  sodium chloride infusion   Intravenous Continuous Delfino Lovett, MD 75 mL/hr at 11/07/15 0054 75 mL/hr at 11/07/15 0054  . acetaminophen (TYLENOL) tablet 650 mg  650 mg Oral Q6H PRN Delfino Lovett, MD       Or  . acetaminophen (TYLENOL) suppository 650 mg  650 mg Rectal Q6H PRN Vipul Sherryll Burger, MD      . albuterol (PROVENTIL) (2.5 MG/3ML) 0.083% nebulizer solution 3 mL  3 mL Inhalation Q6H PRN Delfino Lovett, MD      . aspirin chewable tablet 81 mg  81 mg Oral Daily Vipul Shah, MD      . atenolol (TENORMIN) tablet 25 mg  25 mg Oral Daily Vipul Shah, MD      . bisacodyl (DULCOLAX) EC tablet 5 mg  5 mg Oral Daily PRN Delfino Lovett, MD      . calcium-vitamin D (OSCAL WITH D) 500-200 MG-UNIT per tablet 1 tablet  1 tablet Oral BID Delfino Lovett, MD   1 tablet at 11/07/15 0054  . docusate sodium (COLACE) capsule 100 mg  100 mg Oral BID Delfino Lovett, MD   100 mg  at 11/07/15 0054  . galantamine (RAZADYNE) tablet 8 mg  8 mg Oral BID Delfino LovettVipul Shah, MD   8 mg at 11/07/15 0129  . heparin injection 5,000 Units  5,000 Units Subcutaneous Q8H Delfino LovettVipul Shah, MD   5,000 Units at 11/07/15 0550  . hydrALAZINE (APRESOLINE) injection 10 mg  10 mg Intravenous Q4H PRN Wyatt Hasteavid K Hower, MD      . HYDROcodone-acetaminophen (NORCO/VICODIN) 5-325 MG per tablet 1 tablet  1 tablet Oral Q4H PRN Wyatt Hasteavid K Hower, MD      . mometasone-formoterol [facility](DULERA) 200-5 MCG/ACT inhaler 2 puff  2 puff Inhalation BID Delfino LovettVipul Shah, MD   2 puff at 11/07/15 0854  . morphine 2 MG/ML injection 2 mg  2 mg Intravenous Q2H PRN Wyatt Hasteavid K Hower, MD   1 mg at 11/07/15 0908  . ondansetron (ZOFRAN) tablet 4 mg  4 mg Oral Q6H PRN Delfino LovettVipul Shah, MD       Or  . ondansetron (ZOFRAN) injection 4 mg  4 mg  Intravenous Q6H PRN Delfino LovettVipul Shah, MD      . tiotropium (SPIRIVA) inhalation capsule 18 mcg  18 mcg Inhalation Daily Delfino LovettVipul Shah, MD   18 mcg at 11/07/15 0855  . traZODone (DESYREL) tablet 25 mg  25 mg Oral QHS PRN Delfino LovettVipul Shah, MD   25 mg at 11/07/15 0054     Discharge Medications: Please see discharge summary for a list of discharge medications.  Relevant Imaging Results:  Relevant Lab Results:   Additional Information  (SSN: [id number])  Haig ProphetMorgan, Damoni Causby G, LCSW

## 2015-11-07 NOTE — Consult Note (Cosign Needed)
Confused 82 year old seen at bedside today for evaluation of compression fracture. Patient was uncooperative and is a poor historian. She has diffuse tenderness in the mid back but is difficult to get a focal finding because of her confusion. Skin is intact. Her family is not present to get more information about her prior level of function. Radiographs were reviewed and with CTs showing what appears to be a new L to compression fracture possibly some progression of prior T11 fracture. Either one of these could be giving her the pain she is currently 7 from or the multiple rib fractures. I would hold off on any invasive treatment with a kyphoplasty unless things become more localized and recommend physical therapy to try to get her mobilized. If she cannot tolerate that we need to get an MRI of the cervical lumbar spine to know exactly what levels are acute.

## 2015-11-07 NOTE — Clinical Social Work Placement (Signed)
   CLINICAL SOCIAL WORK PLACEMENT  NOTE  Date:  11/07/2015  Patient Details  Name: Servando SalinaFrances A Bunney MRN: [id number] Date of Birth: Sep 03, 1930  Clinical Social Work is seeking post-discharge placement for this patient at the Skilled  Nursing Facility level of care (*CSW will initial, date and re-position this form in  chart as items are completed):  Yes   Patient/family provided with [facility] Clinical Social Work Department's list of facilities offering this level of care within the geographic area requested by the patient (or if unable, by the patient's family).  Yes   Patient/family informed of their freedom to choose among providers that offer the needed level of care, that participate in Medicare, Medicaid or managed care program needed by the patient, have an available bed and are willing to accept the patient.  Yes   Patient/family informed of [facility]'s ownership interest in [facility] and [facility], as well as of the fact that they are under no obligation to receive care at these facilities.  PASRR submitted to EDS on       PASRR number received on       Existing PASRR number confirmed on 11/07/15     FL2 transmitted to all facilities in geographic area requested by pt/family on 11/07/15     FL2 transmitted to all facilities within larger geographic area on       Patient informed that his/her managed care company has contracts with or will negotiate with certain facilities, including the following:        Yes   Patient/family informed of bed offers received.  Patient [facility] bed at  [facility] )     Physician recommends and patient chooses bed at      Patient to be transferred to   on  .  Patient to be transferred to facility by       Patient family notified on   of transfer.  Name of family member notified:        PHYSICIAN       Additional Comment:    _______________________________________________ Haig ProphetMorgan, Eulamae Greenstein G,  LCSW 11/07/2015, 2:53 PM

## 2015-11-07 NOTE — Care Management Note (Signed)
Case Management Note  Patient Details  Name: Katherine Bartlett MRN: [id number] Date of Birth: [birth year]  Subjective/Objective:                   Met with patient to discuss discharge planning. Patient could not state year or month however she was able to tell me that she is from home with her daughter [name] which she depends on for transportation. She states she usually walks with a rolling walker. She is currently in significant pain pending palliative and ortho consult for ribs/new lumbar fx. Her PCP is Dr. Benita Stabile.   Action/Plan: PT is recommending SNF. I left copy of home health agencies at bedside along with my contact number. RNCM will follow with CSW.   Expected Discharge Date:                  Expected Discharge Plan:     In-House Referral:  Clinical Social Work  Discharge planning Services  CM Consult  Post Acute Care Choice:    Choice offered to:  Patient  DME Arranged:    DME Agency:     HH Arranged:    [place] Agency:     Status of Service:  In process, will continue to follow  Medicare Important Message Given:    Date Medicare IM Given:    Medicare IM give by:    Date Additional Medicare IM Given:    Additional Medicare Important Message give by:     If discussed at [place] of Stay Meetings, dates discussed:    Additional Comments:  Marshell Garfinkel, RN 11/07/2015, 11:15 AM

## 2015-11-07 NOTE — Progress Notes (Signed)
Physical Therapy Evaluation Patient Details Name: Katherine Bartlett MRN: [id number] DOB: 05/27/1930 Today's Date: 11/07/2015   History of Present Illness  Ola Raap is a 82 y.o. female with a known history of past medical history of dementia, hypertension, COPD who presents to the emergency department after a fall.According to the patient she was at home getting ready when she fell landing on her back and left side.  Pt with old T11-T12 fracture and New L2 compression fracture without significant loss of vertebral body height per x-rays.  Ortho consult pending.  Clinical Impression  Pt presents to PT with severe pain limiting pt's functional mobility.  Pt required +2 assist for scooting and rolling in bed for repositioning.  Pt with muscle guarding in B LE's due to pain and unable to assist with repositioning.  At this time, recommend f/u services 5x/week.  Plan to complete OOB mobility assessment next session as appropriate.    Follow Up Recommendations SNF    Equipment Recommendations   (defer to post acute)    Recommendations for Other Services       Precautions / Restrictions Precautions Precautions: Fall;Back Required Braces or Orthoses:  (none currently, awaiting ortho consult) Restrictions Weight Bearing Restrictions: No      Mobility  Bed Mobility Overal bed mobility: Needs Assistance;+2 for physical assistance Bed Mobility: Rolling Rolling: Total assist;+2 for physical assistance         General bed mobility comments: Pt screaming in pain with scooting and rolling in bed for repositioning.  Transfers Overall transfer level:  (unable to assess)                  Ambulation/Gait Ambulation/Gait assistance:  (unable to assess)              Stairs            Wheelchair Mobility    Modified Rankin (Stroke Patients Only)       Balance Overall balance assessment:  (unable to assess)                                            Pertinent Vitals/Pain Pain Assessment: 0-10 Pain Location: back and hip Pain Descriptors / Indicators: Aching;Crying;Grimacing;Guarding Pain Intervention(s): Limited activity within patient's tolerance;Patient requesting pain meds-RN notified;Repositioned    Home Living Family/patient expects to be discharged to:: Private residence Living Arrangements: Children (daughter) Available Help at Discharge: Family;Available 24 hours/day Type of Home: House Home Access: Stairs to enter   [employer] of Steps: 3 Home Layout: One level Home Equipment: Walker - 2 wheels Additional Comments: Pt with limited recall of home situation, information obtained from chart.    Prior Function Level of Independence: Independent with assistive device(s)         Comments: ambulated using rw at all time, mostly household distances     Hand Dominance        Extremity/Trunk Assessment   Upper Extremity Assessment: Generalized weakness;Difficult to assess due to impaired cognition           Lower Extremity Assessment: Generalized weakness;Difficult to assess due to impaired cognition (muscle guarding due to pain)         Communication   Communication: No difficulties  Cognition Arousal/Alertness: Awake/alert Behavior During Therapy: WFL for tasks assessed/performed Overall Cognitive Status: History of cognitive impairments - at baseline       Memory:  Decreased short-term memory              General Comments General comments (skin integrity, edema, etc.): visible areas c/d/i    Exercises        Assessment/Plan    PT Assessment Patient needs continued PT services  PT Diagnosis Acute pain   PT Problem List Decreased strength;Decreased activity tolerance;Decreased mobility;Decreased cognition;Pain  PT Treatment Interventions DME instruction;Gait training;Stair training;Functional mobility training;Therapeutic activities;Therapeutic exercise;Balance  training;Patient/family education   PT Goals (Current goals can be found in the Care Plan section) Acute Rehab PT Goals Patient Stated Goal: To have less pain. PT Goal Formulation: With patient Time For Goal Achievement: 11/14/15 Potential to Achieve Goals: Fair    Frequency 7X/week   Barriers to discharge        Co-evaluation               End of Session   Activity Tolerance: Patient limited by pain Patient left: in bed;with call bell/phone within reach;with bed alarm set Nurse Communication: Mobility status;Patient requests pain meds         Time: 1015-1045 PT Time Calculation (min) (ACUTE ONLY): 30 min   Charges:   PT Evaluation $PT Eval Low Complexity: 1 Procedure     PT G Codes:        Merci Walthers A Aerin Delany, PT 11/07/2015, 10:59 AM

## 2015-11-07 NOTE — Clinical Social Work Note (Signed)
Clinical Social Work Assessment  Patient Details  Name: Katherine Bartlett MRN: [id number] Date of Birth: June 13, 1930  Date of referral:  11/07/15               Reason for consult:  Facility Placement                Permission sought to share information with:  Chartered certified accountant granted to share information::  Yes, Verbal Permission Granted  Name::      Katherine::   Bartlett   Relationship::     Contact Information:     Housing/Transportation Living arrangements for the past 2 months:  [place] of Information:  Patient, Adult Children Patient Interpreter Needed:  None Criminal Activity/Legal Involvement Pertinent to Current Situation/Hospitalization:  No - Comment as needed Significant Relationships:  Adult Children Lives with:  Adult Children Do you feel safe going back to the place where you live?  Yes Need for family participation in patient care:  Yes (Comment)  Care giving concerns:  Patient lives with her daughter Katherine Bartlett in [place].    Social Worker assessment / plan:  Holiday representative (CSW) received SNF consult. PT is recommending SNF. CSW met with patient alone at bedside at beginning of assessment. Patient answered basic questions and had a flat affect. Per patient she lives with her daughter Katherine Bartlett in [place] Katherine Bartlett goes by [employer]). Per patient they live near [address] and [place] works during the day. CSW explained to patient that PT is recommending SNF. Patient is agreeable to SNF search and asked CSW to talk to [facility] about SNF preference. CSW contacted patient's daughter Katherine Bartlett as she was walking in [facility]. CSW met with Katherine Bartlett outside of patient's room. Per daughter patient has been living with her for the past 4 years and has a Therapist, sports that comes in and cares for patient. Daughter became tearful when describing that patient fell while she was at home. Katherine Bartlett dicussed how she felt guilty for  letting patient fall. CSW provided emotional support and explained that a fall can happen at any level of care and it is not her fault. CSW discussed short term rehab options with daughter and explained that patient will require a 3 night qualifying inpatient stay in order for Medicare to pay for SNF. Patient was admitted to inpatient on 11/06/15. Daughter verbalized her understanding and is agreeable to SNF search.   CSW presented bed offers to daughter. Daughter chose [facility]. Per daughter patient went to [facility] 4 years ago and it is very close to their home. [place] admissions coordinator at [facility] is aware of accepted bed offer. Plan is for patient to D/C to [place] Wednesday 11/09/15 pending medical clearance. CSW will continue to follow and assist as needed.     Employment status:  Disabled (Comment on whether or not currently receiving Disability), Retired Forensic scientist:  Medicare PT Recommendations:  [place] / Referral to community resources:  [place]  Patient/Family's Response to care:  Patient and daughter are agreeable for patient to go to [facility].   Patient/Family's Understanding of and Emotional Response to Diagnosis, Current Treatment, and Prognosis:  Patient and daughter were pleasant and thanked CSW for visit.   Emotional Assessment Appearance:  Appears stated age Attitude/Demeanor/Rapport:    Affect (typically observed):  Accepting, Adaptable, Pleasant Orientation:  Oriented to Self, Oriented to Place, Fluctuating Orientation (Suspected and/or reported Sundowners) Alcohol /  Substance use:  Not Applicable Psych involvement (Current and /or in the community):  No (Comment)  Discharge Needs  Concerns to be addressed:  Discharge Planning Concerns Readmission within the last 30 days:  No Current discharge risk:  Dependent with Mobility Barriers to Discharge:  Continued Medical  Work up   Elwyn Reach 11/07/2015, 2:54 PM

## 2015-11-07 NOTE — Progress Notes (Addendum)
New Admission Note:   Arrival Method: per bed from ED, pt came from home living with daughter Mental Orientation: alert, oriented to person and place Telemetry: none ordered Assessment: Completed Skin: warm, dry, with laceration on the right elbow sustained from fall, cleansed and covered with foam dressing IV: G20 on the left forearm with transparenet dressing, intact Pain: complains of pain on her back but pt couldn't rate it, will administer PRN pain medicine  Safety Measures: Safety Fall Prevention Plan has been given and discussed Admission: Completed Family: no family member at bedside  Orders have been reviewed and implemented. Will continue to monitor the patient. Call light has been placed within reach and bed alarm has been activated.   Janice NorrieAnessa Ashaad Gaertner BSN, RN [facility]

## 2015-11-08 LAB — VITAMIN D 25 HYDROXY (VIT D DEFICIENCY, FRACTURES): Vit D, 25-Hydroxy: 25 ng/mL — ABNORMAL LOW (ref 30.0–100.0)

## 2015-11-08 MED ORDER — VITAMIN D (ERGOCALCIFEROL) 1.25 MG (50000 UNIT) PO CAPS
50000.0000 [IU] | ORAL_CAPSULE | ORAL | Status: DC
Start: 2015-11-08 — End: 2015-11-09
  Administered 2015-11-08: 50000 [IU] via ORAL
  Filled 2015-11-08: qty 1

## 2015-11-08 NOTE — Progress Notes (Signed)
[facility] [place] at [facility]   PATIENT NAME: Katherine Bartlett    MRN#:  [id number]  DATE OF BIRTH:  [birth year]  SUBJECTIVE:  Hospital Day: 2 days Katherine Bartlett is a 82 y.o. female presenting with Fall .   Overnight events: No overnight events Interval Events:States pain is "bad" unable to further quantify/qualify remains confused  REVIEW OF SYSTEMS:  Unable to fully obtain given patient's mental status-dementia  DRUG ALLERGIES:  No Known Allergies  VITALS:  Blood pressure 151/63, pulse 86, temperature 98.3 F (36.8 C), temperature source Oral, resp. rate 18, height 5\' 4"  (1.626 m), weight 134 lb 8 oz (61.009 kg), SpO2 97 %.  PHYSICAL EXAMINATION:  VITAL SIGNS: Filed Vitals:   11/08/15 1102 11/08/15 1400  BP:  151/63  Pulse: 73 86  Temp:  98.3 F (36.8 C)  Resp:  18   GENERAL:82 y.o.female currently in Moderate acute distress. Given pain HEAD: Normocephalic, atraumatic.  EYES: Pupils equal, round, reactive to light. Extraocular muscles intact. No scleral icterus.  MOUTH: Moist mucosal membrane. Dentition intact. No abscess noted.  EAR, NOSE, THROAT: Clear without exudates. No external lesions.  NECK: Supple. No thyromegaly. No nodules. No JVD.  PULMONARY: Clear to ascultation, without wheeze rails or rhonci. No use of accessory muscles, Good respiratory effort. good air entry bilaterally CHEST: Nontender to palpation.  CARDIOVASCULAR: S1 and S2. Regular rate and rhythm. No murmurs, rubs, or gallops. No edema. Pedal pulses 2+ bilaterally.  GASTROINTESTINAL: Soft, nontender, nondistended. No masses. Positive bowel sounds. No hepatosplenomegaly.  MUSCULOSKELETAL: No swelling, clubbing, or edema. Range of motion full in all extremities.  NEUROLOGIC: Cranial nerves II through XII are intact. No gross focal neurological deficits. Sensation intact. Reflexes intact.  SKIN: No ulceration, lesions, rashes, or cyanosis. Skin warm and dry. Turgor  intact.  PSYCHIATRIC: Mood, affect anxious agitated The patient is awake, alert and oriented self Insight, judgment poor.      LABORATORY PANEL:   CBC  Recent Labs Lab 11/07/15 0335  WBC 10.7  HGB 12.4  HCT 36.6  PLT 256   ------------------------------------------------------------------------------------------------------------------  Chemistries   Recent Labs Lab 11/06/15 1950 11/07/15 0335  NA 137 139  K 3.6 3.5  CL 108 106  CO2 24 25  GLUCOSE 94 143*  BUN 9 18  CREATININE 0.78 0.60  CALCIUM 9.3 8.4*  AST 21  --   ALT 12*  --   ALKPHOS 67  --   BILITOT 0.2*  --    ------------------------------------------------------------------------------------------------------------------  Cardiac Enzymes No results for input(s): TROPONINI in the last 168 hours. ------------------------------------------------------------------------------------------------------------------  RADIOLOGY:  Dg Chest 1 View  11/06/2015  CLINICAL DATA:  Status post fall today.  Pain. EXAM: CHEST 1 VIEW COMPARISON:  Single-view of the chest 10/14/2015 and 07/14/2015. FINDINGS: The lungs are clear. No pneumothorax or pleural effusion. Heart size is upper normal. The patient is status post cervical fusion. Degenerative disease left shoulder is noted. IMPRESSION: No acute disease. Electronically Signed   By: Drusilla Kanner M.D.   On: 11/06/2015 19:14   Dg Pelvis 1-2 Views  11/06/2015  CLINICAL DATA:  Left-sided pain after a fall today. Initial encounter. EXAM: PELVIS - 1-2 VIEW COMPARISON:  CT abdomen and pelvis and single view of the tuft pelvis 09/06/2015. FINDINGS: No acute bony or joint abnormality is identified. Left worse than right hip degenerative change is seen. Calcification centrally in the pelvis is consistent with a urinary bladder stone as seen on the prior examinations. IMPRESSION: No acute  abnormality. Left worse than right hip osteoarthritis. Urinary bladder stone is seen on prior  studies. Electronically Signed   By: Drusilla Kannerhomas  Dalessio M.D.   On: 11/06/2015 19:13   Ct Head Wo Contrast  11/06/2015  CLINICAL DATA:  Status post fall today. Altered mental status. Initial encounter. EXAM: CT HEAD WITHOUT CONTRAST CT CERVICAL SPINE WITHOUT CONTRAST TECHNIQUE: Multidetector CT imaging of the head and cervical spine was performed following the standard protocol without intravenous contrast. Multiplanar CT image reconstructions of the cervical spine were also generated. COMPARISON:  Head and cervical spine CT scans 09/06/2015. FINDINGS: CT HEAD FINDINGS There is extensive chronic microvascular ischemic change. Cortical atrophy is identified. No acute abnormality including hemorrhage, infarct, mass lesion, mass effect, midline shift or abnormal extra-axial fluid collection. No hydrocephalus or pneumocephalus. Right maxillary sinus mucosal thickening is improved. Imaged left maxillary sinus is now clear. There is some ethmoid air cell disease. The calvarium is intact. CT CERVICAL SPINE FINDINGS The patient is status post C6-7 ACDF and posterior fusion from C4-T3. No acute fracture is identified. Trace anterolisthesis C4 on C5 is noted and unchanged. Multilevel uncovertebral disease is seen. Lung apices demonstrate some apical scar. IMPRESSION: No acute abnormality head or cervical spine. Atrophy and extensive chronic microvascular ischemic change. Improved appearance of bilateral maxillary sinus disease. Status post C6-7 ACDF and posterior fusion C4-T3. Electronically Signed   By: Drusilla Kannerhomas  Dalessio M.D.   On: 11/06/2015 19:39   Ct Cervical Spine Wo Contrast  11/06/2015  CLINICAL DATA:  Status post fall today. Altered mental status. Initial encounter. EXAM: CT HEAD WITHOUT CONTRAST CT CERVICAL SPINE WITHOUT CONTRAST TECHNIQUE: Multidetector CT imaging of the head and cervical spine was performed following the standard protocol without intravenous contrast. Multiplanar CT image reconstructions of the  cervical spine were also generated. COMPARISON:  Head and cervical spine CT scans 09/06/2015. FINDINGS: CT HEAD FINDINGS There is extensive chronic microvascular ischemic change. Cortical atrophy is identified. No acute abnormality including hemorrhage, infarct, mass lesion, mass effect, midline shift or abnormal extra-axial fluid collection. No hydrocephalus or pneumocephalus. Right maxillary sinus mucosal thickening is improved. Imaged left maxillary sinus is now clear. There is some ethmoid air cell disease. The calvarium is intact. CT CERVICAL SPINE FINDINGS The patient is status post C6-7 ACDF and posterior fusion from C4-T3. No acute fracture is identified. Trace anterolisthesis C4 on C5 is noted and unchanged. Multilevel uncovertebral disease is seen. Lung apices demonstrate some apical scar. IMPRESSION: No acute abnormality head or cervical spine. Atrophy and extensive chronic microvascular ischemic change. Improved appearance of bilateral maxillary sinus disease. Status post C6-7 ACDF and posterior fusion C4-T3. Electronically Signed   By: Drusilla Kannerhomas  Dalessio M.D.   On: 11/06/2015 19:39   Ct Thoracic Spine Wo Contrast  11/06/2015  CLINICAL DATA:  Fall today with back pain.  Altered mental status. EXAM: CT THORACIC AND LUMBAR SPINE WITHOUT CONTRAST TECHNIQUE: Multidetector CT imaging of the thoracic and lumbar spine was performed without contrast. Multiplanar CT image reconstructions were also generated. COMPARISON:  CT chest 09/10/2012 and CT abdomen/ pelvis 09/06/2015, lumbar spine films 09/06/2015. FINDINGS: CT THORACIC SPINE FINDINGS Partially visualized anterior fusion hardware over the lower cervical spine and posterior fusion hardware over the cervical thoracic spine extending to the T3 level is intact. Vertebral body alignment is within normal. There is mild spondylosis throughout the thoracic spine. Disc spaces are within normal. There is a severe T12 compression fracture stable. Minimal  retropulsion of the superior aspect of the T12 compression fracture causing  mild impression upon the anterior thecal sac but no significant canal stenosis or cord compression. Mild T11 compression fracture without significant change. Possible mild compression along the superior endplate of T10 age indeterminate. Subtle loss of mid to anterior vertebral body height of T3 unchanged. Subacute fractures of the left posterior tenth, eleventh and twelfth ribs. Acute to subacute fracture of the right posterior seventh rib as well as the right posterior fourth and fifth ribs and possibly third. Mild adjacent atelectatic change. No pneumothorax. CT LUMBAR SPINE FINDINGS Lumbar spine demonstrates normal vertebral body alignment, heights and disc space heights. There is mild spondylosis throughout the lumbar spine. There is a fracture involving the mid to superior aspect of the L2 vertebral body without significant loss of vertebral body height. No free fragments within the canal. No significant retropulsion. This finding is new since 09/06/2015. Axial images demonstrate mild broad-based disc bulge from the L2-3 level to the L5-S1 level worse at the L4-5 and L5-S1 levels. No focal disc herniation. No significant canal stenosis. Mild left-sided neural foraminal narrowing at the L3-4 level and mild bilateral neural foraminal narrowing at the L4-5. Moderate facet arthropathy at the L4-5 and L5-S1 levels. IMPRESSION: CT THORACIC SPINE IMPRESSION Severe compression fracture of T12 with mild retropulsion of the fracture unchanged with mild T11 compression deformity stable since 09/06/2015. Possible compression of the superior endplate of T11 likely new since 09/06/2015. Mild spondylosis throughout the thoracic spine. Fusion hardware over the upper thoracic spine stable. Stable mild loss of mid to anterior vertebral body of T3. Findings suggesting acute to subacute fractures of the right third, fourth, fifth and seventh ribs with  subacute fractures of the left posterior tenth through twelfth ribs. CT LUMBAR SPINE IMPRESSION Acute fracture of the mid to superior aspect of the L2 vertebral body without significant loss of vertebral body height as this is new since 09/06/2015. No retropulsion or free fragments in the canal. Mild spondylosis throughout the lumbar spine with multilevel disc disease worse at the L4-5 and L5-S1 levels. No focal disc herniation. Neural foraminal narrowing at several levels as described Electronically Signed   By: Elberta Fortis M.D.   On: 11/06/2015 19:58   Ct Lumbar Spine Wo Contrast  11/06/2015  CLINICAL DATA:  Fall today with back pain.  Altered mental status. EXAM: CT THORACIC AND LUMBAR SPINE WITHOUT CONTRAST TECHNIQUE: Multidetector CT imaging of the thoracic and lumbar spine was performed without contrast. Multiplanar CT image reconstructions were also generated. COMPARISON:  CT chest 09/10/2012 and CT abdomen/ pelvis 09/06/2015, lumbar spine films 09/06/2015. FINDINGS: CT THORACIC SPINE FINDINGS Partially visualized anterior fusion hardware over the lower cervical spine and posterior fusion hardware over the cervical thoracic spine extending to the T3 level is intact. Vertebral body alignment is within normal. There is mild spondylosis throughout the thoracic spine. Disc spaces are within normal. There is a severe T12 compression fracture stable. Minimal retropulsion of the superior aspect of the T12 compression fracture causing mild impression upon the anterior thecal sac but no significant canal stenosis or cord compression. Mild T11 compression fracture without significant change. Possible mild compression along the superior endplate of T10 age indeterminate. Subtle loss of mid to anterior vertebral body height of T3 unchanged. Subacute fractures of the left posterior tenth, eleventh and twelfth ribs. Acute to subacute fracture of the right posterior seventh rib as well as the right posterior fourth and  fifth ribs and possibly third. Mild adjacent atelectatic change. No pneumothorax. CT LUMBAR SPINE FINDINGS Lumbar spine  demonstrates normal vertebral body alignment, heights and disc space heights. There is mild spondylosis throughout the lumbar spine. There is a fracture involving the mid to superior aspect of the L2 vertebral body without significant loss of vertebral body height. No free fragments within the canal. No significant retropulsion. This finding is new since 09/06/2015. Axial images demonstrate mild broad-based disc bulge from the L2-3 level to the L5-S1 level worse at the L4-5 and L5-S1 levels. No focal disc herniation. No significant canal stenosis. Mild left-sided neural foraminal narrowing at the L3-4 level and mild bilateral neural foraminal narrowing at the L4-5. Moderate facet arthropathy at the L4-5 and L5-S1 levels. IMPRESSION: CT THORACIC SPINE IMPRESSION Severe compression fracture of T12 with mild retropulsion of the fracture unchanged with mild T11 compression deformity stable since 09/06/2015. Possible compression of the superior endplate of T11 likely new since 09/06/2015. Mild spondylosis throughout the thoracic spine. Fusion hardware over the upper thoracic spine stable. Stable mild loss of mid to anterior vertebral body of T3. Findings suggesting acute to subacute fractures of the right third, fourth, fifth and seventh ribs with subacute fractures of the left posterior tenth through twelfth ribs. CT LUMBAR SPINE IMPRESSION Acute fracture of the mid to superior aspect of the L2 vertebral body without significant loss of vertebral body height as this is new since 09/06/2015. No retropulsion or free fragments in the canal. Mild spondylosis throughout the lumbar spine with multilevel disc disease worse at the L4-5 and L5-S1 levels. No focal disc herniation. Neural foraminal narrowing at several levels as described Electronically Signed   By: Elberta Fortis M.D.   On: 11/06/2015 19:58     EKG:   Orders placed or performed during the hospital encounter of 10/14/15  . ED EKG  . ED EKG  . EKG 12-Lead  . EKG 12-Lead    ASSESSMENT AND PLAN:   Kimisha Eunice is a 82 y.o. female presenting with Fall . Admitted 11/06/2015 : Day #: 2 days Intractable pain secondary to multiple rib fractures as well as L2 compression fracture: Continue IV opioids for pain control, and bowel regiment, orthopedic input appreciated  2 COPD unspecified - Continue home inhalers  3 Hypertension essential - Continue atenolol, currently poorly controlled however still in pain obtain better pain control reassess blood pressure  4 Recurrent fall 5. Vitamin D deficiency: Increase replacement Disposition: SNF   All the records are reviewed and case discussed with Care Management/Social Workerr. Management plans discussed with the patient, family and they are in agreement.  CODE STATUS: full TOTAL TIME TAKING CARE OF THIS PATIENT: 28 minutes.   POSSIBLE D/C IN 1-2DAYS, DEPENDING ON CLINICAL CONDITION.   Hower,  Mardi Mainland.D on 11/08/2015 at 3:29 PM  Between 7am to 6pm - Pager - [phone]  After 6pm: House Pager: - [phone]  [facility]  Office  [phone]  CC: Primary care physician; No primary care provider on file.

## 2015-11-08 NOTE — Care Management Important Message (Signed)
Important Message  Patient Details  Name: Servando SalinaFrances A Edelman MRN: [id number] Date of Birth: 10-04-1930   Medicare Important Message Given:       Verita SchneidersKathy A Adynn Caseres 11/08/2015, 2:04 PM

## 2015-11-08 NOTE — Progress Notes (Signed)
Physical Therapy Treatment Patient Details Name: Katherine Bartlett MRN: [id number] DOB: [birth year] Today's Date: 11/08/2015    History of Present Illness Katherine Bartlett is a 82 y.o. female with a known history of past medical history of dementia, hypertension, COPD who presents to the emergency department after a fall. pt. presents with  back and left side pain, as well as Right shoulder pain. Pt with old T11-T12 fracture and New L2 compression fracture without significant loss of vertebral body height per x-rays.  Ortho consult pending.    PT Comments    Pt initially reports pain in back as mild; agreeable to PT. Pt participates with some difficulty following instructions for active assisted range of motion exercises in supine. Pt with limited range/fights some motion, but does not complain of increased pain at that time. Pt reports needing to use the bathroom and agreeable to getting up to bedside commode initially. Pt requires increased instruction and assist, but gets to edge of bed briefly; pt begins to yell "make the pain stop" and pushes self back to bed. Unable to encourage/assist pt to attempt further transfer up in bed. Pt repositioned comfortably in bed and notes pain has subsided. Continue PT to progress strength and tolerance of activity to improve functional mobility.   Follow Up Recommendations  SNF     Equipment Recommendations       Recommendations for Other Services       Precautions / Restrictions Precautions Precautions: Fall;Back Restrictions Weight Bearing Restrictions: No    Mobility  Bed Mobility Overal bed mobility: Needs Assistance Bed Mobility: Supine to Sit     Supine to sit: Mod assist     General bed mobility comments: Pt very anxious with moving; gets to sitting with Mod A of 1, but begins yelling "make it stop" and pushes self back to supine. Unable to encourage/assist pt to sitting again.  Transfers                 General transfer  comment: Unable to attempt  Ambulation/Gait                 Stairs            Wheelchair Mobility    Modified Rankin (Stroke Patients Only)       Balance                                    Cognition Arousal/Alertness: Awake/alert Behavior During Therapy: Anxious Overall Cognitive Status: History of cognitive impairments - at baseline                      Exercises General Exercises - Lower Extremity Ankle Circles/Pumps: AAROM;10 reps;Supine Quad Sets: Other (comment) (unable to follow instructions) Short Arc Quad: Other (comment);AAROM;Both;10 reps;Supine (yells with placement of pillows) Heel Slides: AAROM;Both;10 reps;Supine (partial range) Hip ABduction/ADduction: AAROM;Both;10 reps;Supine (very limited range)    General Comments        Pertinent Vitals/Pain Pain Assessment:  (Does not quantify; initially mild/ increases to yelling pain) Pain Location: Back Pain Intervention(s): Limited activity within patient's tolerance    Home Living                      Prior Function            PT Goals (current goals can now be found in the care plan section) Progress towards  PT goals: Not progressing toward goals - comment    Frequency  7X/week    PT Plan Current plan remains appropriate    Co-evaluation             End of Session   Activity Tolerance: Patient limited by pain;Treatment limited secondary to agitation Patient left: in bed;with call bell/phone within reach;with bed alarm set;with SCD's reapplied     Time: [id number] PT Time Calculation (min) (ACUTE ONLY): 23 min  Charges:  $Therapeutic Exercise: 8-22 mins $Therapeutic Activity: 8-22 mins                    G Codes:      Kristeen MissHeidi Elizabeth Bishop, PTA 11/08/2015, 1:01 PM

## 2015-11-08 NOTE — Care Management Important Message (Signed)
Important Message  Patient Details  Name: Katherine Bartlett MRN: [id number] Date of Birth: [birth year]   Medicare Important Message Given:  Yes    Olegario MessierKathy A Malek Skog 11/08/2015, 2:05 PM

## 2015-11-09 MED ORDER — HYDROCODONE-ACETAMINOPHEN 5-325 MG PO TABS: 1.0000 | ORAL_TABLET | ORAL | Status: DC | PRN

## 2015-11-09 MED ORDER — DOCUSATE SODIUM 100 MG PO CAPS: 100.0000 mg | ORAL_CAPSULE | Freq: Two times a day (BID) | ORAL | Status: DC

## 2015-11-09 MED ORDER — BISACODYL 5 MG PO TBEC: 5.0000 mg | DELAYED_RELEASE_TABLET | Freq: Every day | ORAL | Status: DC | PRN

## 2015-11-09 MED ORDER — VITAMIN D (ERGOCALCIFEROL) 1.25 MG (50000 UNIT) PO CAPS
50000.0000 [IU] | ORAL_CAPSULE | ORAL | Status: DC
Start: 2015-11-09 — End: 2015-11-30

## 2015-11-09 MED ORDER — BISACODYL 10 MG RE SUPP
10.0000 mg | Freq: Once | RECTAL | Status: AC
  Administered 2015-11-09: 10 mg via RECTAL
  Filled 2015-11-09: qty 1

## 2015-11-09 MED ORDER — FLEET ENEMA 7-19 GM/118ML RE ENEM
1.0000 | ENEMA | Freq: Once | RECTAL | Status: AC
  Administered 2015-11-09: 1 via RECTAL

## 2015-11-09 NOTE — Progress Notes (Signed)
Patient is medically stable for D/C to [facility] today. Per [facility] admissions coordinator [facility] will go to room 3-B. RN will call report and arrange EMS for transport. Clinical Child psychotherapistocial Worker (CSW) sent D/C Summary, FL2 and D/C Packet to [facility] via [facility]. Patient is aware of above. CSW contacted patient's daughter Alvis LemmingsDawn and made her aware of above. Please reconsult if future social work needs arise. CSW signing off.   Jetta LoutBailey Morgan, LCSW [phone]

## 2015-11-09 NOTE — Clinical Social Work Placement (Signed)
   CLINICAL SOCIAL WORK PLACEMENT  NOTE  Date:  11/09/2015  Patient Details  Name: Katherine Bartlett MRN: [id number] Date of Birth: 01-28-1931  Clinical Social Work is seeking post-discharge placement for this patient at the Skilled  Nursing Facility level of care (*CSW will initial, date and re-position this form in  chart as items are completed):  Yes   Patient/family provided with [facility] Clinical Social Work Department's list of facilities offering this level of care within the geographic area requested by the patient (or if unable, by the patient's family).  Yes   Patient/family informed of their freedom to choose among providers that offer the needed level of care, that participate in Medicare, Medicaid or managed care program needed by the patient, have an available bed and are willing to accept the patient.  Yes   Patient/family informed of [facility]'s ownership interest in [facility] and [facility], as well as of the fact that they are under no obligation to receive care at these facilities.  PASRR submitted to EDS on       PASRR number received on       Existing PASRR number confirmed on 11/07/15     FL2 transmitted to all facilities in geographic area requested by pt/family on 11/07/15     FL2 transmitted to all facilities within larger geographic area on       Patient informed that his/her managed care company has contracts with or will negotiate with certain facilities, including the following:        Yes   Patient/family informed of bed offers received.  Patient chooses bed at  [facility] )     Physician recommends and patient chooses bed at      Patient to be transferred to  [facility] ) on 11/09/15.  Patient to be transferred to facility by  [facility] )     Patient family notified on 11/09/15 of transfer.  Name of family member notified:   (Patient's daughter Alvis LemmingsDawn is aware of D/C today. )     PHYSICIAN        Additional Comment:    _______________________________________________ Haig ProphetMorgan, Atzin Buchta G, LCSW 11/09/2015, 11:32 AM

## 2015-11-09 NOTE — Discharge Summary (Signed)
[employer] - [place] at [facility]   PATIENT NAME: Katherine Bartlett    MR#:  [id number]  DATE OF BIRTH:  October 31, 1930  DATE OF ADMISSION:  11/06/2015 ADMITTING PHYSICIAN: Delfino Lovett, MD  DATE OF DISCHARGE: 11/09/2015  PRIMARY CARE PHYSICIAN: No primary care provider on file.    ADMISSION DIAGNOSIS:  Fall [W19.XXXA] Lumbar compression fracture, closed, initial encounter (HCC) [S32.000A] Multiple rib fractures, right, closed, initial encounter [S22.41XA]  DISCHARGE DIAGNOSIS:  Fall [W19.XXXA] Lumbar compression fracture, closed, initial encounter (HCC) [S32.000A] Multiple rib fractures, right, closed, initial encounter [S22.41XA] Vitamin d deficiency   SECONDARY DIAGNOSIS:   Past Medical History  Diagnosis Date  . Dementia   . Osteoporosis   . Hypertension   . COPD (chronic obstructive pulmonary disease) [facility])     HOSPITAL COURSE:  Katherine Bartlett  is a 82 y.o. female admitted 11/06/2015 with chief complaint Fall . Please see H&P performed by Delfino Lovett, MD for further information. Patient presented with after mechanical fall with complaints of severe pain. She underwent imaging anf findings below.  Severe compression fracture of T12 with mild retropulsion of the fracture unchanged with mild T11 compression deformity stable since 09/06/2015. Possible compression of the superior endplate of T11 likely new since 09/06/2015.  Mild spondylosis throughout the thoracic spine. Fusion hardware over the upper thoracic spine stable. Stable mild loss of mid to anterior vertebral body of T3.  Findings suggesting acute to subacute fractures of the right third, fourth, fifth and seventh ribs with subacute fractures of the left posterior tenth through twelfth ribs.  CT LUMBAR SPINE IMPRESSION  Acute fracture of the mid to superior aspect of the L2 vertebral body without significant loss of vertebral body height as this is new since 09/06/2015. No  retropulsion or free fragments in the canal.  Mild spondylosis throughout the lumbar spine with multilevel disc disease worse at the L4-5 and L5-S1 levels. No focal disc herniation. Neural foraminal narrowing at several levels as Described   She was evaluated by orthopedic surgery who deemed no surgical intervention was required or would offer much benefit. She has been reasonably controlled on pain medication DISCHARGE CONDITIONS:   stable  CONSULTS OBTAINED:  Treatment Team:  Kennedy Bucker, MD  DRUG ALLERGIES:  No Known Allergies  DISCHARGE MEDICATIONS:   Current Discharge Medication List    START taking these medications   Details  bisacodyl (DULCOLAX) 5 MG EC tablet Take 1 tablet (5 mg total) by mouth daily as needed for moderate constipation. Qty: 30 tablet, Refills: 0    docusate sodium (COLACE) 100 MG capsule Take 1 capsule (100 mg total) by mouth 2 (two) times daily. Qty: 10 capsule, Refills: 0    HYDROcodone-acetaminophen (NORCO/VICODIN) 5-325 MG tablet Take 1-2 tablets by mouth every 4 (four) hours as needed for moderate pain. Qty: 30 tablet, Refills: 0    Vitamin D, Ergocalciferol, (DRISDOL) 50000 units CAPS capsule Take 1 capsule (50,000 Units total) by mouth every 7 (seven) days. Qty: 4 capsule, Refills: 0      CONTINUE these medications which have NOT CHANGED   Details  albuterol (PROVENTIL HFA;VENTOLIN HFA) 108 (90 Base) MCG/ACT inhaler Inhale 2 puffs into the lungs every 6 (six) hours as needed for wheezing or shortness of breath.    alendronate (FOSAMAX) 70 MG tablet Take 70 mg by mouth once a week. Take with a full glass of water on an empty stomach.    aspirin 81 MG tablet Take 81 mg by mouth daily.  atenolol (TENORMIN) 25 MG tablet Take 25 mg by mouth daily.    Calcium Carbonate-Vitamin D (CALCIUM 600+D) 600-400 MG-UNIT tablet Take 1 tablet by mouth 2 (two) times daily.    Fluticasone-Salmeterol (ADVAIR) 250-50 MCG/DOSE AEPB Inhale 1 puff into  the lungs 2 (two) times daily.    galantamine (RAZADYNE) 8 MG tablet Take 8 mg by mouth 2 (two) times daily.    tiotropium (SPIRIVA) 18 MCG inhalation capsule Place 18 mcg into inhaler and inhale daily.      STOP taking these medications     ciprofloxacin (CIPRO) 500 MG tablet      predniSONE (DELTASONE) 20 MG tablet          DISCHARGE INSTRUCTIONS:    DIET:  Regular diet  DISCHARGE CONDITION:  Stable  ACTIVITY:  Activity as tolerated  OXYGEN:  Home Oxygen: No.   Oxygen Delivery: room air  DISCHARGE LOCATION:  nursing home   If you experience worsening of your admission symptoms, develop shortness of breath, life threatening emergency, suicidal or homicidal thoughts you must seek medical attention immediately by calling 911 or calling your MD immediately  if symptoms less severe.  You Must read complete instructions/literature along with all the possible adverse reactions/side effects for all the Medicines you take and that have been prescribed to you. Take any new Medicines after you have completely understood and accpet all the possible adverse reactions/side effects.   Please note  You were cared for by a hospitalist during your hospital stay. If you have any questions about your discharge medications or the care you received while you were in the hospital after you are discharged, you can call the unit and asked to speak with the hospitalist on call if the hospitalist that took care of you is not available. Once you are discharged, your primary care physician will handle any further medical issues. Please note that NO REFILLS for any discharge medications will be authorized once you are discharged, as it is imperative that you return to your primary care physician (or establish a relationship with a primary care physician if you do not have one) for your aftercare needs so that they can reassess your need for medications and monitor your lab values.    On the day of  Discharge:   VITAL SIGNS:  Blood pressure 118/55, pulse 83, temperature 98.6 F (37 C), temperature source Oral, resp. rate 18, height 5\' 4"  (1.626 m), weight 131 lb 14.4 oz (59.829 kg), SpO2 94 %.  I/O:   Intake/Output Summary (Last 24 hours) at 11/09/15 0942 Last data filed at 11/09/15 0937  Gross per 24 hour  Intake    360 ml  Output      0 ml  Net    360 ml    PHYSICAL EXAMINATION:  GENERAL:  82 y.o.-year-old patient lying in the bed with no acute distress.  EYES: Pupils equal, round, reactive to light and accommodation. No scleral icterus. Extraocular muscles intact.  HEENT: Head atraumatic, normocephalic. Oropharynx and nasopharynx clear.  NECK:  Supple, no jugular venous distention. No thyroid enlargement, no tenderness.  LUNGS: Normal breath sounds bilaterally, no wheezing, rales,rhonchi or crepitation. No use of accessory muscles of respiration.  CARDIOVASCULAR: S1, S2 normal. No murmurs, rubs, or gallops.  ABDOMEN: Soft, non-tender, non-distended. Bowel sounds present. No organomegaly or mass.  EXTREMITIES: No pedal edema, cyanosis, or clubbing.  NEUROLOGIC: Cranial nerves II through XII are intact. Muscle strength 4/5 in all extremities. Sensation intact. Gait not checked.  PSYCHIATRIC:baseline dementia SKIN: No obvious rash, lesion, or ulcer.   DATA REVIEW:   CBC  Recent Labs Lab 11/07/15 0335  WBC 10.7  HGB 12.4  HCT 36.6  PLT 256    Chemistries   Recent Labs Lab 11/06/15 1950 11/07/15 0335  NA 137 139  K 3.6 3.5  CL 108 106  CO2 24 25  GLUCOSE 94 143*  BUN 9 18  CREATININE 0.78 0.60  CALCIUM 9.3 8.4*  AST 21  --   ALT 12*  --   ALKPHOS 67  --   BILITOT 0.2*  --     Cardiac Enzymes No results for input(s): TROPONINI in the last 168 hours.  Microbiology Results  Results for orders placed or performed during the hospital encounter of 07/14/15  Blood Culture (routine x 2)     Status: None   Collection Time: 07/14/15  8:42 PM  Result  Value Ref Range Status   Specimen Description BLOOD LEFT WRIST  Final   Special Requests BOTTLES DRAWN AEROBIC AND ANAEROBIC 1CCANA,1AERO  Final   Culture  Setup Time   Final    GRAM NEGATIVE RODS IN BOTH AEROBIC AND ANAEROBIC BOTTLES CRITICAL RESULT CALLED TO, READ BACK BY AND VERIFIED WITH: Roque CashALLISON THARAKAN 07/15/15 1027AM MLM    Culture   Final    ESCHERICHIA COLI IN BOTH AEROBIC AND ANAEROBIC BOTTLES    Report Status 07/18/2015 FINAL  Final   Organism ID, Bacteria ESCHERICHIA COLI  Final      Susceptibility   Escherichia coli - MIC*    AMPICILLIN >=32 RESISTANT Resistant     CEFAZOLIN <=4 SENSITIVE Sensitive     CEFEPIME <=1 SENSITIVE Sensitive     CEFTAZIDIME <=1 SENSITIVE Sensitive     CEFTRIAXONE <=1 SENSITIVE Sensitive     CIPROFLOXACIN 0.5 SENSITIVE Sensitive     GENTAMICIN <=1 SENSITIVE Sensitive     IMIPENEM <=0.25 SENSITIVE Sensitive     TRIMETH/SULFA >=320 RESISTANT Resistant     AMPICILLIN/SULBACTAM >=32 RESISTANT Resistant     PIP/TAZO 8 SENSITIVE Sensitive     Extended ESBL NEGATIVE Sensitive     * ESCHERICHIA COLI  Urine culture     Status: None   Collection Time: 07/14/15  8:42 PM  Result Value Ref Range Status   Specimen Description URINE, RANDOM  Final   Special Requests NONE  Final   Culture >=100,000 COLONIES/mL ESCHERICHIA COLI  Final   Report Status 07/17/2015 FINAL  Final   Organism ID, Bacteria ESCHERICHIA COLI  Final      Susceptibility   Escherichia coli - MIC*    AMPICILLIN Value in next row Resistant      RESISTANT>=32    CEFTRIAXONE Value in next row Sensitive      SENSITIVE<=1    CIPROFLOXACIN Value in next row Sensitive      SENSITIVE0.5    GENTAMICIN Value in next row Sensitive      SENSITIVE<=1    IMIPENEM Value in next row Sensitive      SENSITIVE<=0.258    NITROFURANTOIN Value in next row Sensitive      SENSITIVE<=16    TRIMETH/SULFA Value in next row Resistant      RESISTANT>=320    PIP/TAZO Value in next row Sensitive       SENSITIVE8    * >=100,000 COLONIES/mL ESCHERICHIA COLI  Blood Culture ID Panel (Reflexed)     Status: Abnormal   Collection Time: 07/14/15  8:42 PM  Result Value Ref Range Status  Enterococcus species NOT DETECTED NOT DETECTED Final   Vancomycin resistance NOT DETECTED NOT DETECTED Final   Listeria monocytogenes NOT DETECTED NOT DETECTED Final   Staphylococcus species NOT DETECTED NOT DETECTED Final   Staphylococcus aureus NOT DETECTED NOT DETECTED Final   Methicillin resistance NOT DETECTED NOT DETECTED Final   Streptococcus species NOT DETECTED NOT DETECTED Final   Streptococcus agalactiae NOT DETECTED NOT DETECTED Final   Streptococcus pneumoniae NOT DETECTED NOT DETECTED Final   Streptococcus pyogenes NOT DETECTED NOT DETECTED Final   Acinetobacter baumannii NOT DETECTED NOT DETECTED Final   Enterobacteriaceae species DETECTED (A) NOT DETECTED Final    Comment: CRITICAL RESULT CALLED TO, READ BACK BY AND VERIFIED WITH: ALLISON THARAKAN 07/15/15 1027AM MLM    Enterobacter cloacae complex NOT DETECTED NOT DETECTED Final   Escherichia coli DETECTED (A) NOT DETECTED Final    Comment: CRITICAL RESULT CALLED TO, READ BACK BY AND VERIFIED WITH: Roque Cash 07/15/15 1027AM MLM    Klebsiella oxytoca NOT DETECTED NOT DETECTED Final   Klebsiella pneumoniae NOT DETECTED NOT DETECTED Final   Proteus species NOT DETECTED NOT DETECTED Final   Serratia marcescens NOT DETECTED NOT DETECTED Final   Carbapenem resistance NOT DETECTED NOT DETECTED Final   Haemophilus influenzae NOT DETECTED NOT DETECTED Final   Neisseria meningitidis NOT DETECTED NOT DETECTED Final   Pseudomonas aeruginosa NOT DETECTED NOT DETECTED Final   Candida albicans NOT DETECTED NOT DETECTED Final   Candida glabrata NOT DETECTED NOT DETECTED Final   Candida krusei NOT DETECTED NOT DETECTED Final   Candida parapsilosis NOT DETECTED NOT DETECTED Final   Candida tropicalis NOT DETECTED NOT DETECTED Final  Blood Culture  (routine x 2)     Status: None   Collection Time: 07/14/15  9:10 PM  Result Value Ref Range Status   Specimen Description BLOOD RIGHT HAND  Final   Special Requests BOTTLES DRAWN AEROBIC AND ANAEROBIC 2CCAERO,4CCANA  Final   Culture  Setup Time   Final    GRAM NEGATIVE RODS IN BOTH AEROBIC AND ANAEROBIC BOTTLES CRITICAL VALUE NOTED.  VALUE IS CONSISTENT WITH PREVIOUSLY REPORTED AND CALLED VALUE.    Culture   Final    ESCHERICHIA COLI IN BOTH AEROBIC AND ANAEROBIC BOTTLES    Report Status 07/17/2015 FINAL  Final   Organism ID, Bacteria ESCHERICHIA COLI  Final      Susceptibility   Escherichia coli - MIC*    AMPICILLIN >=32 RESISTANT Resistant     CEFAZOLIN <=4 SENSITIVE Sensitive     CEFEPIME <=1 SENSITIVE Sensitive     CEFTAZIDIME <=1 SENSITIVE Sensitive     CEFTRIAXONE <=1 SENSITIVE Sensitive     CIPROFLOXACIN <=0.25 SENSITIVE Sensitive     GENTAMICIN <=1 SENSITIVE Sensitive     IMIPENEM <=0.25 SENSITIVE Sensitive     TRIMETH/SULFA >=320 RESISTANT Resistant     AMPICILLIN/SULBACTAM >=32 RESISTANT Resistant     PIP/TAZO 8 SENSITIVE Sensitive     Extended ESBL NEGATIVE Sensitive     * ESCHERICHIA COLI  Culture, blood (Routine X 2) w Reflex to ID Panel     Status: None   Collection Time: 07/17/15 12:43 AM  Result Value Ref Range Status   Specimen Description BLOOD LEFT ASSIST CONTROL  Final   Special Requests BOTTLES DRAWN AEROBIC AND ANAEROBIC 10CC  Final   Culture NO GROWTH 5 DAYS  Final   Report Status 07/22/2015 FINAL  Final  Culture, blood (Routine X 2) w Reflex to ID Panel     Status: None  Collection Time: 07/17/15 12:43 AM  Result Value Ref Range Status   Specimen Description BLOOD RIGHT ASSIST CONTROL  Final   Special Requests BOTTLES DRAWN AEROBIC AND ANAEROBIC 10CC  Final   Culture NO GROWTH 5 DAYS  Final   Report Status 07/22/2015 FINAL  Final    RADIOLOGY:  No results found.   Management plans discussed with the patient, family and they are in  agreement.  CODE STATUS:     Code Status Orders        Start     Ordered   11/07/15 0028  Full code   Continuous     11/07/15 0027    Code Status History    Date Active Date Inactive Code Status Order ID Comments User Context   07/14/2015 11:35 PM 07/18/2015 12:15 AM Full Code [id number]  Milagros Loll, MD ED      TOTAL TIME TAKING CARE OF THIS PATIENT: 28 minutes.    Hower,  Mardi Mainland.D on 11/09/2015 at 9:42 AM  Between 7am to 6pm - Pager - [phone]  After 6pm go to [email or web address] - Scientist, research (life sciences)  Hospitalists  Office  [phone]  CC: Primary care physician; No primary care provider on file.

## 2015-11-09 NOTE — Progress Notes (Signed)
Called EMS for transport at 6pm. Had sm BM. Refused heparin and atenolol

## 2015-11-27 ENCOUNTER — Emergency Department: Payer: Medicare Other

## 2015-11-27 ENCOUNTER — Inpatient Hospital Stay
Admission: EM | Admit: 2015-11-27 | Discharge: 2015-11-30 | DRG: 871 | Payer: Medicare Other | Attending: Internal Medicine | Admitting: Internal Medicine

## 2015-11-27 ENCOUNTER — Encounter: Payer: Self-pay | Admitting: Emergency Medicine

## 2015-11-27 DIAGNOSIS — R52 Pain, unspecified: Secondary | ICD-10-CM | POA: Insufficient documentation

## 2015-11-27 DIAGNOSIS — J9601 Acute respiratory failure with hypoxia: Secondary | ICD-10-CM | POA: Diagnosis present

## 2015-11-27 DIAGNOSIS — A419 Sepsis, unspecified organism: Principal | ICD-10-CM | POA: Diagnosis present

## 2015-11-27 DIAGNOSIS — F0390 Unspecified dementia without behavioral disturbance: Secondary | ICD-10-CM | POA: Diagnosis present

## 2015-11-27 DIAGNOSIS — R7989 Other specified abnormal findings of blood chemistry: Secondary | ICD-10-CM | POA: Diagnosis present

## 2015-11-27 DIAGNOSIS — E871 Hypo-osmolality and hyponatremia: Secondary | ICD-10-CM | POA: Diagnosis present

## 2015-11-27 DIAGNOSIS — Z7951 Long term (current) use of inhaled steroids: Secondary | ICD-10-CM | POA: Diagnosis not present

## 2015-11-27 DIAGNOSIS — Z79899 Other long term (current) drug therapy: Secondary | ICD-10-CM

## 2015-11-27 DIAGNOSIS — E860 Dehydration: Secondary | ICD-10-CM | POA: Diagnosis present

## 2015-11-27 DIAGNOSIS — I4891 Unspecified atrial fibrillation: Secondary | ICD-10-CM | POA: Diagnosis present

## 2015-11-27 DIAGNOSIS — Z7983 Long term (current) use of bisphosphonates: Secondary | ICD-10-CM | POA: Diagnosis not present

## 2015-11-27 DIAGNOSIS — I959 Hypotension, unspecified: Secondary | ICD-10-CM | POA: Diagnosis present

## 2015-11-27 DIAGNOSIS — D72829 Elevated white blood cell count, unspecified: Secondary | ICD-10-CM

## 2015-11-27 DIAGNOSIS — E875 Hyperkalemia: Secondary | ICD-10-CM | POA: Diagnosis present

## 2015-11-27 DIAGNOSIS — R6521 Severe sepsis with septic shock: Secondary | ICD-10-CM | POA: Diagnosis present

## 2015-11-27 DIAGNOSIS — Z681 Body mass index (BMI) 19 or less, adult: Secondary | ICD-10-CM

## 2015-11-27 DIAGNOSIS — J449 Chronic obstructive pulmonary disease, unspecified: Secondary | ICD-10-CM | POA: Diagnosis present

## 2015-11-27 DIAGNOSIS — I248 Other forms of acute ischemic heart disease: Secondary | ICD-10-CM | POA: Diagnosis present

## 2015-11-27 DIAGNOSIS — R296 Repeated falls: Secondary | ICD-10-CM | POA: Diagnosis present

## 2015-11-27 DIAGNOSIS — Z515 Encounter for palliative care: Secondary | ICD-10-CM | POA: Diagnosis present

## 2015-11-27 DIAGNOSIS — R64 Cachexia: Secondary | ICD-10-CM | POA: Diagnosis present

## 2015-11-27 DIAGNOSIS — E872 Acidosis, unspecified: Secondary | ICD-10-CM

## 2015-11-27 DIAGNOSIS — I10 Essential (primary) hypertension: Secondary | ICD-10-CM | POA: Diagnosis present

## 2015-11-27 DIAGNOSIS — R739 Hyperglycemia, unspecified: Secondary | ICD-10-CM | POA: Diagnosis present

## 2015-11-27 DIAGNOSIS — R627 Adult failure to thrive: Secondary | ICD-10-CM | POA: Diagnosis present

## 2015-11-27 DIAGNOSIS — E870 Hyperosmolality and hypernatremia: Secondary | ICD-10-CM

## 2015-11-27 DIAGNOSIS — N179 Acute kidney failure, unspecified: Secondary | ICD-10-CM | POA: Insufficient documentation

## 2015-11-27 DIAGNOSIS — N10 Acute tubulo-interstitial nephritis: Secondary | ICD-10-CM | POA: Diagnosis present

## 2015-11-27 DIAGNOSIS — M810 Age-related osteoporosis without current pathological fracture: Secondary | ICD-10-CM | POA: Diagnosis present

## 2015-11-27 DIAGNOSIS — G9341 Metabolic encephalopathy: Secondary | ICD-10-CM | POA: Diagnosis present

## 2015-11-27 DIAGNOSIS — R778 Other specified abnormalities of plasma proteins: Secondary | ICD-10-CM

## 2015-11-27 DIAGNOSIS — Z7982 Long term (current) use of aspirin: Secondary | ICD-10-CM

## 2015-11-27 DIAGNOSIS — E876 Hypokalemia: Secondary | ICD-10-CM | POA: Diagnosis present

## 2015-11-27 DIAGNOSIS — Z9889 Other specified postprocedural states: Secondary | ICD-10-CM | POA: Diagnosis not present

## 2015-11-27 DIAGNOSIS — Z66 Do not resuscitate: Secondary | ICD-10-CM | POA: Diagnosis present

## 2015-11-27 DIAGNOSIS — D689 Coagulation defect, unspecified: Secondary | ICD-10-CM | POA: Diagnosis present

## 2015-11-27 DIAGNOSIS — K5900 Constipation, unspecified: Secondary | ICD-10-CM | POA: Diagnosis present

## 2015-11-27 DIAGNOSIS — N170 Acute kidney failure with tubular necrosis: Secondary | ICD-10-CM | POA: Diagnosis present

## 2015-11-27 DIAGNOSIS — N390 Urinary tract infection, site not specified: Secondary | ICD-10-CM

## 2015-11-27 LAB — GLUCOSE, CAPILLARY
GLUCOSE-CAPILLARY: 204 mg/dL — AB (ref 65–99)
Glucose-Capillary: 203 mg/dL — ABNORMAL HIGH (ref 65–99)
Glucose-Capillary: 227 mg/dL — ABNORMAL HIGH (ref 65–99)
Glucose-Capillary: 258 mg/dL — ABNORMAL HIGH (ref 65–99)

## 2015-11-27 LAB — LIPASE, BLOOD: Lipase: 36 U/L (ref 11–51)

## 2015-11-27 LAB — URINALYSIS COMPLETE WITH MICROSCOPIC (ARMC ONLY)
Bilirubin Urine: NEGATIVE
GLUCOSE, UA: NEGATIVE mg/dL
HGB URINE DIPSTICK: NEGATIVE
Nitrite: NEGATIVE
Protein, ur: >500 mg/dL — AB
SPECIFIC GRAVITY, URINE: 1.014 (ref 1.005–1.030)
pH: 8 (ref 5.0–8.0)

## 2015-11-27 LAB — CBC
HEMATOCRIT: 43.1 % (ref 35.0–47.0)
HEMOGLOBIN: 13.6 g/dL (ref 12.0–16.0)
MCH: 30.2 pg (ref 26.0–34.0)
MCHC: 31.6 g/dL — AB (ref 32.0–36.0)
MCV: 95.6 fL (ref 80.0–100.0)
PLATELETS: 221 10*3/uL (ref 150–440)
RBC: 4.51 MIL/uL (ref 3.80–5.20)
RDW: 17.1 % — AB (ref 11.5–14.5)
WBC: 22.2 10*3/uL — AB (ref 3.6–11.0)

## 2015-11-27 LAB — BASIC METABOLIC PANEL
Anion gap: 8 (ref 5–15)
BUN: 143 mg/dL — ABNORMAL HIGH (ref 6–20)
BUN: 146 mg/dL — AB (ref 6–20)
CALCIUM: 6.6 mg/dL — AB (ref 8.9–10.3)
CO2: 15 mmol/L — AB (ref 22–32)
CO2: 21 mmol/L — ABNORMAL LOW (ref 22–32)
CREATININE: 4.89 mg/dL — AB (ref 0.44–1.00)
CREATININE: 4.04 mg/dL — AB (ref 0.44–1.00)
Calcium: 7.3 mg/dL — ABNORMAL LOW (ref 8.9–10.3)
Chloride: 128 mmol/L — ABNORMAL HIGH (ref 101–111)
GFR calc Af Amer: 11 mL/min — ABNORMAL LOW (ref >60)
GFR calc non Af Amer: 7 mL/min — ABNORMAL LOW (ref >60)
GFR, EST AFRICAN AMERICAN: 9 mL/min — AB (ref >60)
GFR, EST NON AFRICAN AMERICAN: 9 mL/min — AB (ref >60)
GLUCOSE: 196 mg/dL — AB (ref 65–99)
GLUCOSE: 273 mg/dL — AB (ref 65–99)
POTASSIUM: 3.8 mmol/L (ref 3.5–5.1)
Potassium: 5.1 mmol/L (ref 3.5–5.1)
Sodium: 161 mmol/L (ref 135–145)
Sodium: 157 mmol/L — ABNORMAL HIGH (ref 135–145)

## 2015-11-27 LAB — MRSA PCR SCREENING: MRSA BY PCR: NEGATIVE

## 2015-11-27 LAB — CBC WITH DIFFERENTIAL/PLATELET
BASOS PCT: 0 %
Band Neutrophils: 0 %
Basophils Absolute: 0 10*3/uL (ref 0–0.1)
Blasts: 0 %
EOS PCT: 1 %
Eosinophils Absolute: 0.2 10*3/uL (ref 0–0.7)
HEMATOCRIT: 51.8 % — AB (ref 35.0–47.0)
Hemoglobin: 17 g/dL — ABNORMAL HIGH (ref 12.0–16.0)
LYMPHS ABS: 0.8 10*3/uL — AB (ref 1.0–3.6)
Lymphocytes Relative: 4 %
MCH: 30 pg (ref 26.0–34.0)
MCHC: 32.8 g/dL (ref 32.0–36.0)
MCV: 91.4 fL (ref 80.0–100.0)
METAMYELOCYTES PCT: 0 %
MONO ABS: 1 10*3/uL — AB (ref 0.2–0.9)
MONOS PCT: 5 %
MYELOCYTES: 0 %
NEUTROS ABS: 18 10*3/uL — AB (ref 1.4–6.5)
NEUTROS PCT: 90 %
NRBC: 0 /100{WBCs}
Other: 0 %
Platelets: 368 10*3/uL (ref 150–400)
Promyelocytes Absolute: 0 %
RBC: 5.66 MIL/uL — AB (ref 3.80–5.20)
RDW: 15.7 % — ABNORMAL HIGH (ref 11.5–14.5)
WBC: 20 10*3/uL — ABNORMAL HIGH (ref 3.6–11.0)

## 2015-11-27 LAB — COMPREHENSIVE METABOLIC PANEL
ALT: 56 U/L — AB (ref 14–54)
AST: 63 U/L — AB (ref 15–41)
Albumin: 2.5 g/dL — ABNORMAL LOW (ref 3.5–5.0)
Alkaline Phosphatase: 125 U/L (ref 38–126)
Anion gap: 21 — ABNORMAL HIGH (ref 5–15)
BUN: 156 mg/dL — ABNORMAL HIGH (ref 6–20)
CHLORIDE: 129 mmol/L — AB (ref 101–111)
CO2: 14 mmol/L — AB (ref 22–32)
CREATININE: 5.8 mg/dL — AB (ref 0.44–1.00)
Calcium: 8 mg/dL — ABNORMAL LOW (ref 8.9–10.3)
GFR calc non Af Amer: 6 mL/min — ABNORMAL LOW (ref >60)
GFR, EST AFRICAN AMERICAN: 7 mL/min — AB (ref >60)
Glucose, Bld: 209 mg/dL — ABNORMAL HIGH (ref 65–99)
POTASSIUM: 5.4 mmol/L — AB (ref 3.5–5.1)
SODIUM: 164 mmol/L — AB (ref 135–145)
Total Bilirubin: 0.9 mg/dL (ref 0.3–1.2)
Total Protein: 5.7 g/dL — ABNORMAL LOW (ref 6.5–8.1)

## 2015-11-27 LAB — PROTIME-INR
INR: 1.73
Prothrombin Time: 20.2 seconds — ABNORMAL HIGH (ref 11.4–15.0)

## 2015-11-27 LAB — BLOOD GAS, VENOUS
Acid-base deficit: 9.2 mmol/L — ABNORMAL HIGH (ref 0.0–2.0)
BICARBONATE: 15.6 meq/L — AB (ref 21.0–28.0)
O2 Saturation: 79.1 %
PATIENT TEMPERATURE: 37
PCO2 VEN: 31 mmHg — AB (ref 44.0–60.0)
PH VEN: 7.31 — AB (ref 7.320–7.430)
PO2 VEN: 48 mmHg — AB (ref 31.0–45.0)

## 2015-11-27 LAB — TROPONIN I
TROPONIN I: 0.09 ng/mL — AB (ref <0.03)
Troponin I: 0.06 ng/mL (ref <0.03)
Troponin I: 0.08 ng/mL (ref <0.03)

## 2015-11-27 LAB — APTT

## 2015-11-27 LAB — LACTIC ACID, PLASMA
LACTIC ACID, VENOUS: 8.2 mmol/L — AB (ref 0.5–1.9)
Lactic Acid, Venous: 5.8 mmol/L (ref 0.5–1.9)

## 2015-11-27 LAB — TSH: TSH: 1.026 u[IU]/mL (ref 0.350–4.500)

## 2015-11-27 MED ORDER — SODIUM CHLORIDE 0.9 % IV SOLN
INTRAVENOUS | Status: DC
  Administered 2015-11-27: 1000 mL via INTRAVENOUS

## 2015-11-27 MED ORDER — SODIUM CHLORIDE 0.9 % IV BOLUS (SEPSIS)
500.0000 mL | Freq: Once | INTRAVENOUS | Status: AC
  Administered 2015-11-27: 500 mL via INTRAVENOUS

## 2015-11-27 MED ORDER — INSULIN ASPART 100 UNIT/ML ~~LOC~~ SOLN
0.0000 [IU] | SUBCUTANEOUS | Status: DC
  Administered 2015-11-27: 12 [IU] via SUBCUTANEOUS
  Administered 2015-11-28 (×2): 2 [IU] via SUBCUTANEOUS
  Administered 2015-11-28: 4 [IU] via SUBCUTANEOUS
  Administered 2015-11-28: 8 [IU] via SUBCUTANEOUS
  Administered 2015-11-28 – 2015-11-30 (×8): 2 [IU] via SUBCUTANEOUS
  Administered 2015-11-30: 1 [IU] via SUBCUTANEOUS
  Filled 2015-11-27: qty 2
  Filled 2015-11-27: qty 8
  Filled 2015-11-27 (×5): qty 2
  Filled 2015-11-27: qty 4
  Filled 2015-11-27 (×4): qty 2
  Filled 2015-11-27: qty 12
  Filled 2015-11-27: qty 1

## 2015-11-27 MED ORDER — ONDANSETRON HCL 4 MG PO TABS: 4.0000 mg | ORAL_TABLET | Freq: Four times a day (QID) | ORAL | Status: DC | PRN

## 2015-11-27 MED ORDER — PUREFLOW DIALYSIS SOLUTION: INTRAVENOUS | Status: DC

## 2015-11-27 MED ORDER — PIPERACILLIN-TAZOBACTAM 3.375 G IVPB
3.3750 g | Freq: Three times a day (TID) | INTRAVENOUS | Status: DC
  Administered 2015-11-27: 3.375 g via INTRAVENOUS
  Filled 2015-11-27 (×3): qty 50

## 2015-11-27 MED ORDER — HEPARIN SODIUM (PORCINE) 1000 UNIT/ML DIALYSIS: 1000.0000 [IU] | INTRAMUSCULAR | Status: DC | PRN

## 2015-11-27 MED ORDER — TIOTROPIUM BROMIDE MONOHYDRATE 18 MCG IN CAPS
18.0000 ug | ORAL_CAPSULE | Freq: Every day | RESPIRATORY_TRACT | Status: DC
  Filled 2015-11-27: qty 5

## 2015-11-27 MED ORDER — SODIUM BICARBONATE 8.4 % IV SOLN
INTRAVENOUS | Status: DC
  Administered 2015-11-27 – 2015-11-28 (×3): via INTRAVENOUS
  Filled 2015-11-27 (×4): qty 150

## 2015-11-27 MED ORDER — SODIUM CHLORIDE 0.9 % IV BOLUS (SEPSIS)
1000.0000 mL | Freq: Once | INTRAVENOUS | Status: AC
Start: 2015-11-27 — End: 2015-11-27
  Administered 2015-11-27: 1000 mL via INTRAVENOUS

## 2015-11-27 MED ORDER — NOREPINEPHRINE BITARTRATE 1 MG/ML IV SOLN
0.0000 ug/min | INTRAVENOUS | Status: DC
  Administered 2015-11-27: 3 ug/min via INTRAVENOUS
  Administered 2015-11-27: 2 ug/min via INTRAVENOUS
  Filled 2015-11-27: qty 4

## 2015-11-27 MED ORDER — PIPERACILLIN-TAZOBACTAM 3.375 G IVPB 30 MIN
3.3750 g | Freq: Once | INTRAVENOUS | Status: AC
  Administered 2015-11-27: 3.375 g via INTRAVENOUS
  Filled 2015-11-27: qty 50

## 2015-11-27 MED ORDER — SODIUM CHLORIDE 0.9 % IV BOLUS (SEPSIS)
250.0000 mL | Freq: Once | INTRAVENOUS | Status: AC
  Administered 2015-11-27: 250 mL via INTRAVENOUS

## 2015-11-27 MED ORDER — SODIUM CHLORIDE 0.9 % IV BOLUS (SEPSIS)
1000.0000 mL | Freq: Once | INTRAVENOUS | Status: AC
  Administered 2015-11-27: 1000 mL via INTRAVENOUS

## 2015-11-27 MED ORDER — NOREPINEPHRINE 4 MG/250ML-% IV SOLN
0.0000 ug/min | INTRAVENOUS | Status: DC
  Administered 2015-11-27: 13 ug/min via INTRAVENOUS
  Administered 2015-11-28: 5 ug/min via INTRAVENOUS
  Administered 2015-11-28: 9 ug/min via INTRAVENOUS
  Administered 2015-11-29: 4 ug/min via INTRAVENOUS
  Filled 2015-11-27 (×4): qty 250

## 2015-11-27 MED ORDER — MORPHINE SULFATE (PF) 2 MG/ML IV SOLN
2.0000 mg | INTRAVENOUS | Status: DC | PRN
  Administered 2015-11-27 – 2015-11-29 (×3): 2 mg via INTRAVENOUS
  Filled 2015-11-27 (×3): qty 1

## 2015-11-27 MED ORDER — VANCOMYCIN HCL IN DEXTROSE 1-5 GM/200ML-% IV SOLN
1000.0000 mg | INTRAVENOUS | Status: DC
  Filled 2015-11-27: qty 200

## 2015-11-27 MED ORDER — NALOXONE HCL 2 MG/2ML IJ SOSY
2.0000 mg | PREFILLED_SYRINGE | Freq: Once | INTRAMUSCULAR | Status: AC
  Administered 2015-11-27: 2 mg via INTRAVENOUS

## 2015-11-27 MED ORDER — ONDANSETRON HCL 4 MG/2ML IJ SOLN: 4.0000 mg | Freq: Four times a day (QID) | INTRAMUSCULAR | Status: DC | PRN

## 2015-11-27 MED ORDER — HEPARIN SODIUM (PORCINE) 5000 UNIT/ML IJ SOLN
5000.0000 [IU] | Freq: Three times a day (TID) | INTRAMUSCULAR | Status: DC
  Administered 2015-11-27 – 2015-11-29 (×5): 5000 [IU] via SUBCUTANEOUS
  Filled 2015-11-27 (×5): qty 1

## 2015-11-27 MED ORDER — IPRATROPIUM-ALBUTEROL 0.5-2.5 (3) MG/3ML IN SOLN
3.0000 mL | Freq: Four times a day (QID) | RESPIRATORY_TRACT | Status: DC | PRN
  Administered 2015-11-28: 3 mL via RESPIRATORY_TRACT
  Filled 2015-11-27: qty 3

## 2015-11-27 MED ORDER — NALOXONE HCL 2 MG/2ML IJ SOSY
PREFILLED_SYRINGE | INTRAMUSCULAR | Status: AC
  Administered 2015-11-27: 2 mg via INTRAVENOUS
  Filled 2015-11-27: qty 2

## 2015-11-27 MED ORDER — NOREPINEPHRINE 4 MG/250ML-% IV SOLN
INTRAVENOUS | Status: AC
  Administered 2015-11-27: 15 mg
  Filled 2015-11-27: qty 250

## 2015-11-27 MED ORDER — SODIUM CHLORIDE 0.9% FLUSH
3.0000 mL | Freq: Two times a day (BID) | INTRAVENOUS | Status: DC
  Administered 2015-11-27 – 2015-11-30 (×4): 3 mL via INTRAVENOUS

## 2015-11-27 MED ORDER — VANCOMYCIN HCL IN DEXTROSE 1-5 GM/200ML-% IV SOLN
1000.0000 mg | Freq: Once | INTRAVENOUS | Status: AC
  Administered 2015-11-27: 1000 mg via INTRAVENOUS
  Filled 2015-11-27: qty 200

## 2015-11-27 MED ORDER — ACETAMINOPHEN 325 MG PO TABS
650.0000 mg | ORAL_TABLET | Freq: Four times a day (QID) | ORAL | Status: DC | PRN
Start: 2015-11-27 — End: 2015-12-01

## 2015-11-27 MED ORDER — ACETAMINOPHEN 650 MG RE SUPP: 650.0000 mg | Freq: Four times a day (QID) | RECTAL | Status: DC | PRN

## 2015-11-27 MED ORDER — PIPERACILLIN-TAZOBACTAM 3.375 G IVPB
3.3750 g | Freq: Two times a day (BID) | INTRAVENOUS | Status: DC
  Filled 2015-11-27 (×2): qty 50

## 2015-11-27 NOTE — Progress Notes (Signed)
Dr. Dema SeverinMungal has spoken with patient daughter, Alvis LemmingsDawn, at bedside. She has decided to make patient DNR, DNI. She is going to speak to other family members, password set up. Continue IV fluids and abx, but no aggressive care, Levophed titrated to maximum of 15 mcg. Nursing staff could call eLink tonight for gentle IV bolus.

## 2015-11-27 NOTE — Consult Note (Signed)
[facility] CONSULT NOTE    Date: 11/27/2015                  Patient Name:  Katherine Bartlett  MRN: [id number]  DOB: May 25, 1930  Age / Sex: 82 y.o., female         PCP: Jaclyn Shaggy, MD                 Service Requesting Consult: Dr. Imagene Gurney                 Reason for Consult: Acute renal failure, metabolic acidosis            History of Present Illness: Patient is a 82 y.o. female with a PMHx of Dementia, Roxy Manns versus, hypertension, COPD, recent frequent falls him a recent rib fractures, who was admitted to [facility] on 11/27/2015 for evaluation of shortness of breath and altered mental status. Patient cannot provide any history at this point in time. She was here approximately 2 weeks ago with frequent falls and sustained rib fractures. She was subsequently moved over to nursing home, [facility]. Unclear what happened there but it appears that she may have had very poor by mouth intake as she is extremely dehydrated at the moment. Serum sodium is currently 164 with a potassium of 5.4, serum bicarbonate of 14, B1 156, creatinine 5.8, and evidence of hemoconcentration with a hemoglobin of 17.0. After initial evaluation of the patient I called the patient's daughter to determine if the patient's mother would want renal replacement therapy. The patient's daughter requests full aggressive care at this point in time. Critical care physician Dr. Dema Severin has been consulted and is planning to place the patient on the ventilator.   Medications: Outpatient medications: Prescriptions prior to admission  Medication Sig Dispense Refill Last Dose  . acetaminophen (TYLENOL) 500 MG tablet Take 500 mg by mouth every 4 (four) hours as needed for mild pain or moderate pain. *Do not exceed 3 G in 24 hours*   unknown  . albuterol (PROVENTIL HFA;VENTOLIN HFA) 108 (90 Base) MCG/ACT inhaler Inhale 2 puffs into the lungs every 6 (six) hours as needed for wheezing or shortness  of breath.   unknown  . alendronate (FOSAMAX) 70 MG tablet Take 70 mg by mouth once a week. Take with a full glass of water on an empty stomach.   unknown  . aspirin 81 MG tablet Take 81 mg by mouth daily.   unknown  . atenolol (TENORMIN) 25 MG tablet Take 25 mg by mouth daily.   unknown  . bisacodyl (DULCOLAX) 5 MG EC tablet Take 1 tablet (5 mg total) by mouth daily as needed for moderate constipation. 30 tablet 0 unknown  . Calcium Carbonate-Vitamin D (CALCIUM 600+D) 600-400 MG-UNIT tablet Take 1 tablet by mouth 2 (two) times daily.   unknown  . docusate sodium (COLACE) 100 MG capsule Take 1 capsule (100 mg total) by mouth 2 (two) times daily. 10 capsule 0 unknown  . ipratropium-albuterol (DUONEB) 0.5-2.5 (3) MG/3ML SOLN Take 3 mLs by nebulization every 6 (six) hours as needed. For shortness of breath/wheezing.   unknown  . tiotropium (SPIRIVA) 18 MCG inhalation capsule Place 18 mcg into inhaler and inhale daily.   unknown  . Vitamin D, Ergocalciferol, (DRISDOL) 50000 units CAPS capsule Take 1 capsule (50,000 Units total) by mouth every 7 (seven) days. 4 capsule 0 unknown  . HYDROcodone-acetaminophen (NORCO/VICODIN) 5-325 MG tablet Take 1-2 tablets by mouth every 4 (four) hours  as needed for moderate pain. 30 tablet 0     Current medications: Current Facility-Administered Medications  Medication Dose Route Frequency Provider Last Rate Last Dose  . 0.9 %  sodium chloride infusion   Intravenous Continuous Katharina Caper, MD 200 mL/hr at 11/27/15 1743 1,000 mL at 11/27/15 1743  . acetaminophen (TYLENOL) tablet 650 mg  650 mg Oral Q6H PRN Katharina Caper, MD       Or  . acetaminophen (TYLENOL) suppository 650 mg  650 mg Rectal Q6H PRN Katharina Caper, MD      . heparin injection 5,000 Units  5,000 Units Subcutaneous Q8H Katharina Caper, MD      . ipratropium-albuterol (DUONEB) 0.5-2.5 (3) MG/3ML nebulizer solution 3 mL  3 mL Nebulization Q6H PRN Katharina Caper, MD      . norepinephrine (LEVOPHED) 4 mg in  dextrose 5 % 250 mL (0.016 mg/mL) infusion  0-40 mcg/min Intravenous Titrated Katharina Caper, MD 45 mL/hr at 11/27/15 1744 12 mcg/min at 11/27/15 1744  . ondansetron (ZOFRAN) tablet 4 mg  4 mg Oral Q6H PRN Katharina Caper, MD       Or  . ondansetron (ZOFRAN) injection 4 mg  4 mg Intravenous Q6H PRN Katharina Caper, MD      . Melene Muller ON 11/28/2015] piperacillin-tazobactam (ZOSYN) IVPB 3.375 g  3.375 g Intravenous Q12H Katharina Caper, MD      . sodium bicarbonate 150 mEq in dextrose 5 % 1,000 mL infusion   Intravenous Continuous Katharina Caper, MD 100 mL/hr at 11/27/15 1742    . sodium chloride flush (NS) 0.9 % injection 3 mL  3 mL Intravenous Q12H Katharina Caper, MD      . tiotropium (SPIRIVA) inhalation capsule 18 mcg  18 mcg Inhalation Daily Katharina Caper, MD          Allergies: No Known Allergies    Past Medical History: Past Medical History  Diagnosis Date  . Dementia   . Osteoporosis   . Hypertension   . COPD (chronic obstructive pulmonary disease) (HCC)      Past Surgical History: Past Surgical History  Procedure Laterality Date  . Back surgery       Family History: History reviewed. No pertinent family history.   Social History: Social History   Social History  . Marital Status: Widowed    Spouse Name: N/A  . Number of Children: N/A  . Years of Education: N/A   Occupational History  . Not on file.   Social History Main Topics  . Smoking status: Never Smoker   . Smokeless tobacco: Not on file  . Alcohol Use: No  . Drug Use: No  . Sexual Activity: Not on file   Other Topics Concern  . Not on file   Social History Narrative     Review of Systems: Unable to obtain from the patient as shes obtunded.  Vital Signs: Blood pressure 87/58, pulse 36, temperature 96.8 F (36 C), temperature source Core (Comment), resp. rate 30, height 5\' 5"  (1.651 m), weight 54.4 kg (119 lb 14.9 oz), SpO2 100 %.  Weight trends: Filed Weights   11/27/15 1233 11/27/15 1713   Weight: 51.256 kg (113 lb) 54.4 kg (119 lb 14.9 oz)    Physical Exam: General: Obtunded, critically ill appearing  Head: Normocephalic, atraumatic.  Eyes: Anicteric, spontaneous EOMs noted  Nose: Mucous membranes dry, not inflammed, nonerythematous.  Throat: Oropharynx nonerythematous, no exudate appreciated.   Neck: Supple, trachea midline.  Lungs:  Tachypneic, slighty increased work of breathing  Heart: S1S2 tachycardic  Abdomen:  BS normoactive. Soft, Nondistended, non-tender.  No masses or organomegaly.  Extremities: No pretibial edema.  Neurologic: Obtunded, not following commands  Skin: No visible rashes, scars.    Lab results: Basic Metabolic Panel:  Recent Labs Lab 11/27/15 1330  NA 164*  K 5.4*  CL 129*  CO2 14*  GLUCOSE 209*  BUN 156*  CREATININE 5.80*  CALCIUM 8.0*    Liver Function Tests:  Recent Labs Lab 11/27/15 1330  AST 63*  ALT 56*  ALKPHOS 125  BILITOT 0.9  PROT 5.7*  ALBUMIN 2.5*    Recent Labs Lab 11/27/15 1330  LIPASE 36   No results for input(s): AMMONIA in the last 168 hours.  CBC:  Recent Labs Lab 11/27/15 1248 11/27/15 1734  WBC 20.0* 22.2*  NEUTROABS 18.0*  --   HGB 17.0* 13.6  HCT 51.8* 43.1  MCV 91.4 95.6  PLT 368 221    Cardiac Enzymes:  Recent Labs Lab 11/27/15 1330  TROPONINI 0.09*    BNP: Invalid input(s): POCBNP  CBG:  Recent Labs Lab 11/27/15 1306 11/27/15 1708  GLUCAP 204* 203*    Microbiology: Results for orders placed or performed during the hospital encounter of 07/14/15  Blood Culture (routine x 2)     Status: None   Collection Time: 07/14/15  8:42 PM  Result Value Ref Range Status   Specimen Description BLOOD LEFT WRIST  Final   Special Requests BOTTLES DRAWN AEROBIC AND ANAEROBIC 1CCANA,1AERO  Final   Culture  Setup Time   Final    GRAM NEGATIVE RODS IN BOTH AEROBIC AND ANAEROBIC BOTTLES CRITICAL RESULT CALLED TO, READ BACK BY AND VERIFIED WITH: Roque Cash 07/15/15 1027AM  MLM    Culture   Final    ESCHERICHIA COLI IN BOTH AEROBIC AND ANAEROBIC BOTTLES    Report Status 07/18/2015 FINAL  Final   Organism ID, Bacteria ESCHERICHIA COLI  Final      Susceptibility   Escherichia coli - MIC*    AMPICILLIN >=32 RESISTANT Resistant     CEFAZOLIN <=4 SENSITIVE Sensitive     CEFEPIME <=1 SENSITIVE Sensitive     CEFTAZIDIME <=1 SENSITIVE Sensitive     CEFTRIAXONE <=1 SENSITIVE Sensitive     CIPROFLOXACIN 0.5 SENSITIVE Sensitive     GENTAMICIN <=1 SENSITIVE Sensitive     IMIPENEM <=0.25 SENSITIVE Sensitive     TRIMETH/SULFA >=320 RESISTANT Resistant     AMPICILLIN/SULBACTAM >=32 RESISTANT Resistant     PIP/TAZO 8 SENSITIVE Sensitive     Extended ESBL NEGATIVE Sensitive     * ESCHERICHIA COLI  Urine culture     Status: None   Collection Time: 07/14/15  8:42 PM  Result Value Ref Range Status   Specimen Description URINE, RANDOM  Final   Special Requests NONE  Final   Culture >=100,000 COLONIES/mL ESCHERICHIA COLI  Final   Report Status 07/17/2015 FINAL  Final   Organism ID, Bacteria ESCHERICHIA COLI  Final      Susceptibility   Escherichia coli - MIC*    AMPICILLIN Value in next row Resistant      RESISTANT>=32    CEFTRIAXONE Value in next row Sensitive      SENSITIVE<=1    CIPROFLOXACIN Value in next row Sensitive      SENSITIVE0.5    GENTAMICIN Value in next row Sensitive      SENSITIVE<=1    IMIPENEM Value in next row Sensitive      SENSITIVE<=0.258    NITROFURANTOIN  Value in next row Sensitive      SENSITIVE<=16    TRIMETH/SULFA Value in next row Resistant      RESISTANT>=320    PIP/TAZO Value in next row Sensitive      SENSITIVE8    * >=100,000 COLONIES/mL ESCHERICHIA COLI  Blood Culture ID Panel (Reflexed)     Status: Abnormal   Collection Time: 07/14/15  8:42 PM  Result Value Ref Range Status   Enterococcus species NOT DETECTED NOT DETECTED Final   Vancomycin resistance NOT DETECTED NOT DETECTED Final   Listeria monocytogenes NOT DETECTED  NOT DETECTED Final   Staphylococcus species NOT DETECTED NOT DETECTED Final   Staphylococcus aureus NOT DETECTED NOT DETECTED Final   Methicillin resistance NOT DETECTED NOT DETECTED Final   Streptococcus species NOT DETECTED NOT DETECTED Final   Streptococcus agalactiae NOT DETECTED NOT DETECTED Final   Streptococcus pneumoniae NOT DETECTED NOT DETECTED Final   Streptococcus pyogenes NOT DETECTED NOT DETECTED Final   Acinetobacter baumannii NOT DETECTED NOT DETECTED Final   Enterobacteriaceae species DETECTED (A) NOT DETECTED Final    Comment: CRITICAL RESULT CALLED TO, READ BACK BY AND VERIFIED WITH: ALLISON THARAKAN 07/15/15 1027AM MLM    Enterobacter cloacae complex NOT DETECTED NOT DETECTED Final   Escherichia coli DETECTED (A) NOT DETECTED Final    Comment: CRITICAL RESULT CALLED TO, READ BACK BY AND VERIFIED WITH: Roque Cash 07/15/15 1027AM MLM    Klebsiella oxytoca NOT DETECTED NOT DETECTED Final   Klebsiella pneumoniae NOT DETECTED NOT DETECTED Final   Proteus species NOT DETECTED NOT DETECTED Final   Serratia marcescens NOT DETECTED NOT DETECTED Final   Carbapenem resistance NOT DETECTED NOT DETECTED Final   Haemophilus influenzae NOT DETECTED NOT DETECTED Final   Neisseria meningitidis NOT DETECTED NOT DETECTED Final   Pseudomonas aeruginosa NOT DETECTED NOT DETECTED Final   Candida albicans NOT DETECTED NOT DETECTED Final   Candida glabrata NOT DETECTED NOT DETECTED Final   Candida krusei NOT DETECTED NOT DETECTED Final   Candida parapsilosis NOT DETECTED NOT DETECTED Final   Candida tropicalis NOT DETECTED NOT DETECTED Final  Blood Culture (routine x 2)     Status: None   Collection Time: 07/14/15  9:10 PM  Result Value Ref Range Status   Specimen Description BLOOD RIGHT HAND  Final   Special Requests BOTTLES DRAWN AEROBIC AND ANAEROBIC 2CCAERO,4CCANA  Final   Culture  Setup Time   Final    GRAM NEGATIVE RODS IN BOTH AEROBIC AND ANAEROBIC BOTTLES CRITICAL VALUE  NOTED.  VALUE IS CONSISTENT WITH PREVIOUSLY REPORTED AND CALLED VALUE.    Culture   Final    ESCHERICHIA COLI IN BOTH AEROBIC AND ANAEROBIC BOTTLES    Report Status 07/17/2015 FINAL  Final   Organism ID, Bacteria ESCHERICHIA COLI  Final      Susceptibility   Escherichia coli - MIC*    AMPICILLIN >=32 RESISTANT Resistant     CEFAZOLIN <=4 SENSITIVE Sensitive     CEFEPIME <=1 SENSITIVE Sensitive     CEFTAZIDIME <=1 SENSITIVE Sensitive     CEFTRIAXONE <=1 SENSITIVE Sensitive     CIPROFLOXACIN <=0.25 SENSITIVE Sensitive     GENTAMICIN <=1 SENSITIVE Sensitive     IMIPENEM <=0.25 SENSITIVE Sensitive     TRIMETH/SULFA >=320 RESISTANT Resistant     AMPICILLIN/SULBACTAM >=32 RESISTANT Resistant     PIP/TAZO 8 SENSITIVE Sensitive     Extended ESBL NEGATIVE Sensitive     * ESCHERICHIA COLI  Culture, blood (Routine X 2) w Reflex  to ID Panel     Status: None   Collection Time: 07/17/15 12:43 AM  Result Value Ref Range Status   Specimen Description BLOOD LEFT ASSIST CONTROL  Final   Special Requests BOTTLES DRAWN AEROBIC AND ANAEROBIC 10CC  Final   Culture NO GROWTH 5 DAYS  Final   Report Status 07/22/2015 FINAL  Final  Culture, blood (Routine X 2) w Reflex to ID Panel     Status: None   Collection Time: 07/17/15 12:43 AM  Result Value Ref Range Status   Specimen Description BLOOD RIGHT ASSIST CONTROL  Final   Special Requests BOTTLES DRAWN AEROBIC AND ANAEROBIC 10CC  Final   Culture NO GROWTH 5 DAYS  Final   Report Status 07/22/2015 FINAL  Final    Coagulation Studies:  Recent Labs  11/27/15 1554  LABPROT 20.2*  INR 1.73    Urinalysis:  Recent Labs  11/27/15 1316  COLORURINE YELLOW*  LABSPEC 1.014  PHURINE 8.0  GLUCOSEU NEGATIVE  HGBUR NEGATIVE  BILIRUBINUR NEGATIVE  KETONESUR TRACE*  PROTEINUR >500*  NITRITE NEGATIVE  LEUKOCYTESUR 2+*      Imaging: Dg Pelvis 1-2 Views  11/27/2015  CLINICAL DATA:  Pt presents to ED via EMS from [facility] where  SNF staff report pt was found 40 min ago unresponsive with increased respirations and clammy. Staff report yesterday pt was AANDO to self only. Hx frequent UTIs, se.*comment was truncated* EXAM: PELVIS - 1-2 VIEW COMPARISON:  Plain film 09/06/2015 FINDINGS: Remote RIGHT inferior pubic ramus fracture. No evidence acute fracture or dislocation. Bladder calculus noted. IMPRESSION: No acute fracture or dislocation. Remote RIGHT inferior pubic ramus fracture. Electronically Signed   By: Genevive BiStewart  Edmunds M.D.   On: 11/27/2015 13:30   Dg Abd 1 View  11/27/2015  CLINICAL DATA:  Found unresponsive. EXAM: ABDOMEN - 1 VIEW COMPARISON:  CT abdomen FINDINGS: No dilated loops large or small bowel. Moderate volume stool in the rectum. Large bladder calculus noted. No aggressive osseous lesion. Degenerate changes spine. IMPRESSION: No evidence of bowel obstruction. Electronically Signed   By: Genevive BiStewart  Edmunds M.D.   On: 11/27/2015 13:29   Ct Head Wo Contrast  11/27/2015  CLINICAL DATA:  Unresponsive EXAM: CT HEAD WITHOUT CONTRAST TECHNIQUE: Contiguous axial images were obtained from the base of the skull through the vertex without intravenous contrast. COMPARISON:  11/06/2015 FINDINGS: No evidence of parenchymal hemorrhage or extra-axial fluid collection. No mass lesion, mass effect, or midline shift. No CT evidence of acute infarction. Subcortical white matter and periventricular small vessel ischemic changes. Intracranial atherosclerosis. Old left thalamic and right basal ganglia lacunar infarcts. Global cortical and central atrophy. Secondary ventricular prominence. The visualized paranasal sinuses are essentially clear. The mastoid air cells are unopacified. No evidence of calvarial fracture. IMPRESSION: No evidence of acute intracranial abnormality. Atrophy with small vessel ischemic changes. Old lacunar infarcts, as above. Electronically Signed   By: Charline BillsSriyesh  Krishnan M.D.   On: 11/27/2015 14:22   Dg Chest Port 1  View  11/27/2015  CLINICAL DATA:  Unresponsive EXAM: PORTABLE CHEST 1 VIEW COMPARISON:  11/06/2015 FINDINGS: Normal heart size. Lungs clear. No pneumothorax. No pleural effusion. Chronic right-sided rib deformities. IMPRESSION: No active disease. Electronically Signed   By: Jolaine ClickArthur  Hoss M.D.   On: 11/27/2015 13:27      Assessment & Plan: Pt is a 82 y.o. female  with a PMHx of Dementia, Roxy MannsOster versus, hypertension, COPD, recent frequent falls him a recent rib fractures, who was admitted to [facility]  on 11/27/2015 for evaluation of shortness of breath and altered mental status.  1.  Acute renal failure due to ATN N17.0  Patient extremely dehydrated upon initial evaluation. -  Discussed potential treatment options with the patient's daughter and she requests full aggressive care at this point in time. Therefore given critical illness at this point in time we recommend a course of renal replacement therapy. Pulmonary critical care has been consulted and they will be placing temporary dialysis catheter. We will proceed with continuous renal replacement thereafter and continue to monitor O lites closely. No ultrafiltration to be performed. We would recommend ongoing hydration, okay to give 0.9 normal saline for now to replace circulating volume and then would recommend switching over to something more hypotonic.  2. Metabolic acidosis. Patient has lactic acidosis. Pulmonary/critical care to more fully evaluate. Dialysis should partially treat the underlying acidosis.  3. Hyperkalemia. Serum potassium 5.4.  We will place the patient on a 2 potassium bath.  4. Hypernatremia. The patient is being given 0.9 normal saline for low circulating volume at the moment. CRRT should also help to bring down serum sodium. One circulating volume has been replaced would recommend switching to something more hypotonic as above.  5. Acute respiratory failure. The patient is tachypneic and it is felt that she cannot protect her  airway. Therefore she will be undergoing endotracheal intubation and mechanical ventilation.

## 2015-11-27 NOTE — Progress Notes (Signed)
Dr. Dema SeverinMungal has discussed overall care with the pt's daughter in person.  Daughter realizes how sick pt now is, and therefore she has decided upon proceeding with DNR status.  In addition, we will not be pursuing more aggressive care such as intubation or CRRT.  Therefore will d/c CRRT orders at this time. Continue sodium bicarbonate gtt and monitor metabolic parameters.

## 2015-11-27 NOTE — ED Notes (Signed)
Pt presents to ED via EMS from [facility] where SNF staff report pt was found 40 min ago unresponsive with increased respirations and clammy. Staff report yesterday pt was A&O to self only. Hx frequent UTIs, sepsis, multiple fractures, COPD, dementia. EMS unable to get blood pressure. CBG 212. EKG with EMS WNL x2.

## 2015-11-27 NOTE — ED Provider Notes (Addendum)
[facility] Emergency Department Provider Note  ____________________________________________  Time seen: Approximately 12:40 PM  I have reviewed the triage vital signs and the nursing notes.   HISTORY  Chief Complaint No chief complaint on file.    HPI Katherine Bartlett is a 81 y.o. female w/ a hx of advanced dementia, COPD, HTN and recent move to a nursing home for recurrent falls and multiple rib fractures brought by EMS for decreased responsiveness. Unfortunately, the patient is unable to give any history as she is nonverbal at this time. EMS reports that the history is limited at her nursing home because the signout came from a nurse was never worked with this patient, and a second nurse who is only works with her once. The patient's baseline is unclear although there is report that she is usually verbal and alert to person. Per report, the patient was seen at her baseline 15 minutes prior to EMS being called, but was found with decreased responsiveness. EMS notes a blood sugar in the low 200s, tachycardia, tachypnea, and a blood pressure which did not register. There is no other known history although there is report of possible "changes" over the past 4 days. I have attempted to call the patient's daughter who is listed as her emergency contact and have left voicemail son both her home and cell phone numbers.  On arrival to the emergency department, the patient is hypotensive, tachycardic, tachypnea, saturating mid to high 90s on 2 L nasal cannula. She is unresponsive but protecting her airway at this time.   Past Medical History  Diagnosis Date  . Dementia   . Osteoporosis   . Hypertension   . COPD (chronic obstructive pulmonary disease) [facility])     Patient Active Problem List   Diagnosis Date Noted  . Rib fracture 11/06/2015  . UTI (lower urinary tract infection) 07/14/2015  . Fracture of cervical vertebra (HCC) 09/11/2012  . Chronic obstructive  pulmonary disease (HCC) 09/11/2012  . BP (high blood pressure) 09/11/2012    Past Surgical History  Procedure Laterality Date  . Back surgery      Current Outpatient Rx  Name  Route  Sig  Dispense  Refill  . albuterol (PROVENTIL HFA;VENTOLIN HFA) 108 (90 Base) MCG/ACT inhaler   Inhalation   Inhale 2 puffs into the lungs every 6 (six) hours as needed for wheezing or shortness of breath.         Marland Kitchen alendronate (FOSAMAX) 70 MG tablet   Oral   Take 70 mg by mouth once a week. Take with a full glass of water on an empty stomach.         Marland Kitchen aspirin 81 MG tablet   Oral   Take 81 mg by mouth daily.         Marland Kitchen atenolol (TENORMIN) 25 MG tablet   Oral   Take 25 mg by mouth daily.         . bisacodyl (DULCOLAX) 5 MG EC tablet   Oral   Take 1 tablet (5 mg total) by mouth daily as needed for moderate constipation.   30 tablet   0   . Calcium Carbonate-Vitamin D (CALCIUM 600+D) 600-400 MG-UNIT tablet   Oral   Take 1 tablet by mouth 2 (two) times daily.         Marland Kitchen docusate sodium (COLACE) 100 MG capsule   Oral   Take 1 capsule (100 mg total) by mouth 2 (two) times daily.   10 capsule  0   . Fluticasone-Salmeterol (ADVAIR) 250-50 MCG/DOSE AEPB   Inhalation   Inhale 1 puff into the lungs 2 (two) times daily.         Marland Kitchen galantamine (RAZADYNE) 8 MG tablet   Oral   Take 8 mg by mouth 2 (two) times daily.         Marland Kitchen HYDROcodone-acetaminophen (NORCO/VICODIN) 5-325 MG tablet   Oral   Take 1-2 tablets by mouth every 4 (four) hours as needed for moderate pain.   30 tablet   0   . tiotropium (SPIRIVA) 18 MCG inhalation capsule   Inhalation   Place 18 mcg into inhaler and inhale daily.         . Vitamin D, Ergocalciferol, (DRISDOL) 50000 units CAPS capsule   Oral   Take 1 capsule (50,000 Units total) by mouth every 7 (seven) days.   4 capsule   0     Allergies Review of patient's allergies indicates no known allergies.  History reviewed. No pertinent family  history.  Social History Social History  Substance Use Topics  . Smoking status: Never Smoker   . Smokeless tobacco: None  . Alcohol Use: No    Review of Systems Unable to obtain due to patient unresponsiveness.  ____________________________________________   PHYSICAL EXAM:  VITAL SIGNS: ED Triage Vitals  Enc Vitals Group     BP 11/27/15 1233 81/53 mmHg     Pulse Rate 11/27/15 1233 106     Resp 11/27/15 1233 40     Temp --      Temp src --      SpO2 11/27/15 1233 97 %     Weight 11/27/15 1233 113 lb (51.256 kg)     Height 11/27/15 1233 5\' 4"  (1.626 m)     Head Cir --      Peak Flow --      Pain Score --      Pain Loc --      Pain Edu? --      Excl. in GC? --     Constitutional: Patient is alert but not responsive. She is protecting her airway and has a good gag. She is toxic appearing with tachypnea, hypotension. Eyes: The patient does not comply with extraocular movement examination. Her pupils are 2 mm and symmetric as well as reactive bilaterally. Head: Atraumatic. No evidence of raccoon eyes or Battle sign. Nose: No congestion/rhinnorhea. No swelling over the nose. No septal hematoma. Mouth/Throat: Mucous membranes are very dry.  Neck: No stridor.  Supple.  No JVD. No obvious meningismus. Cardiovascular: Fast rate, regular rhythm. No murmurs, rubs or gallops.  Respiratory: Tachypnea with normal air exchange area no wheezes rales or rhonchi. Positive mild accessory muscle use. No retractions. Patient's respiratory abnormalities are more consistent with a metabolic respiratory drive rather than acute pulmonary abnormality. Gastrointestinal: Soft, distended, and patient does grimace with diffuse palpation. No guarding or rebound.  No peritoneal signs. Musculoskeletal: Right lower extremity is is shortened compared to the left. There is no obvious lower extremity swelling, palpable cords, or grimacing with palpation of the calves. Normal DP and PT pulses  bilaterally. Neurologic:  Patient is alert but nonverbal. She is protecting her airway. Face is symmetric. Has not moved any extremities since her arrival and is unable to comply with neurologic examination.  Skin:  Skin is warm, dry and intact. No rash noted. Poor skin turgor. Psychiatric: Unable to assess  ____________________________________________   LABS (all labs ordered are listed, but only  abnormal results are displayed)  Labs Reviewed  LACTIC ACID, PLASMA - Abnormal; Notable for the following:    Lactic Acid, Venous 8.2 (*)    All other components within normal limits  CBC WITH DIFFERENTIAL/PLATELET - Abnormal; Notable for the following:    WBC 20.0 (*)    RBC 5.66 (*)    Hemoglobin 17.0 (*)    HCT 51.8 (*)    RDW 15.7 (*)    Neutro Abs 18.0 (*)    Lymphs Abs 0.8 (*)    Monocytes Absolute 1.0 (*)    All other components within normal limits  URINALYSIS COMPLETEWITH MICROSCOPIC ([facility] ONLY) - Abnormal; Notable for the following:    Color, Urine YELLOW (*)    APPearance TURBID (*)    Ketones, ur TRACE (*)    Protein, ur >500 (*)    Leukocytes, UA 2+ (*)    Bacteria, UA MANY (*)    Squamous Epithelial / LPF 0-5 (*)    All other components within normal limits  BLOOD GAS, VENOUS - Abnormal; Notable for the following:    pH, Ven 7.31 (*)    pCO2, Ven 31 (*)    pO2, Ven 48.0 (*)    Bicarbonate 15.6 (*)    Acid-base deficit 9.2 (*)    All other components within normal limits  GLUCOSE, CAPILLARY - Abnormal; Notable for the following:    Glucose-Capillary 204 (*)    All other components within normal limits  COMPREHENSIVE METABOLIC PANEL - Abnormal; Notable for the following:    Sodium 164 (*)    Potassium 5.4 (*)    Chloride 129 (*)    CO2 14 (*)    Glucose, Bld 209 (*)    BUN 156 (*)    Creatinine, Ser 5.80 (*)    Calcium 8.0 (*)    Total Protein 5.7 (*)    Albumin 2.5 (*)    AST 63 (*)    ALT 56 (*)    GFR calc non Af Amer 6 (*)    GFR calc Af Amer 7 (*)     Anion gap 21 (*)    All other components within normal limits  TROPONIN I - Abnormal; Notable for the following:    Troponin I 0.09 (*)    All other components within normal limits  CULTURE, BLOOD (ROUTINE X 2)  CULTURE, BLOOD (ROUTINE X 2)  URINE CULTURE  LIPASE, BLOOD  LACTIC ACID, PLASMA  PROTIME-INR  APTT  CBG MONITORING, ED   ____________________________________________  EKG  ED ECG REPORT I, Rockne Menghini, the attending physician, personally viewed and interpreted this ECG.   Date: 11/27/2015  EKG Time: 1244  Rate: 109  Rhythm: sinus tachycardia  Axis: normal  Intervals:none  ST&T Change: Nonspecific T-wave inversion in V1. No STEMI.  ____________________________________________  RADIOLOGY  Dg Pelvis 1-2 Views  11/27/2015  CLINICAL DATA:  Pt presents to ED via EMS from [facility] where SNF staff report pt was found 40 min ago unresponsive with increased respirations and clammy. Staff report yesterday pt was AANDO to self only. Hx frequent UTIs, se.*comment was truncated* EXAM: PELVIS - 1-2 VIEW COMPARISON:  Plain film 09/06/2015 FINDINGS: Remote RIGHT inferior pubic ramus fracture. No evidence acute fracture or dislocation. Bladder calculus noted. IMPRESSION: No acute fracture or dislocation. Remote RIGHT inferior pubic ramus fracture. Electronically Signed   By: Genevive Bi M.D.   On: 11/27/2015 13:30   Dg Abd 1 View  11/27/2015  CLINICAL DATA:  Found unresponsive. EXAM: ABDOMEN - 1 VIEW COMPARISON:  CT abdomen FINDINGS: No dilated loops large or small bowel. Moderate volume stool in the rectum. Large bladder calculus noted. No aggressive osseous lesion. Degenerate changes spine. IMPRESSION: No evidence of bowel obstruction. Electronically Signed   By: Genevive BiStewart  Edmunds M.D.   On: 11/27/2015 13:29   Ct Head Wo Contrast  11/27/2015  CLINICAL DATA:  Unresponsive EXAM: CT HEAD WITHOUT CONTRAST TECHNIQUE: Contiguous axial images were obtained  from the base of the skull through the vertex without intravenous contrast. COMPARISON:  11/06/2015 FINDINGS: No evidence of parenchymal hemorrhage or extra-axial fluid collection. No mass lesion, mass effect, or midline shift. No CT evidence of acute infarction. Subcortical white matter and periventricular small vessel ischemic changes. Intracranial atherosclerosis. Old left thalamic and right basal ganglia lacunar infarcts. Global cortical and central atrophy. Secondary ventricular prominence. The visualized paranasal sinuses are essentially clear. The mastoid air cells are unopacified. No evidence of calvarial fracture. IMPRESSION: No evidence of acute intracranial abnormality. Atrophy with small vessel ischemic changes. Old lacunar infarcts, as above. Electronically Signed   By: Charline BillsSriyesh  Krishnan M.D.   On: 11/27/2015 14:22   Dg Chest Port 1 View  11/27/2015  CLINICAL DATA:  Unresponsive EXAM: PORTABLE CHEST 1 VIEW COMPARISON:  11/06/2015 FINDINGS: Normal heart size. Lungs clear. No pneumothorax. No pleural effusion. Chronic right-sided rib deformities. IMPRESSION: No active disease. Electronically Signed   By: Jolaine ClickArthur  Hoss M.D.   On: 11/27/2015 13:27    ____________________________________________   PROCEDURES  Procedure(s) performed: None  Critical Care performed: Yes, see critical care note(s) ____________________________________________   INITIAL IMPRESSION / ASSESSMENT AND PLAN / ED COURSE  Pertinent labs & imaging results that were available during my care of the patient were reviewed by me and considered in my medical decision making (see chart for details).  82 y.o. female nursing home patient presenting with possible acute change in responsiveness, although there is some history that this may be an ongoing process over the last 4 days. On my initial evaluation, I am concerned about the patient's hypotension, tachycardia, and increased respiratory drive. I'm concerned that she may  have a metabolic process which is driving these abnormalities. We will initiate sepsis protocol, and initiate IV antibiotics for infection of unknown source. I will also get a CT scan of her head to evaluate for possible stroke. At this time given her unclear change in baseline, advanced age, she is not a candidate for TPA so could stroke is not called. We will also look for urinary tract infection as well as bacteremia. She does have some distention in the abdomen so I will start with and abdominal plate but consider an intra-abdominal source as well. She does have a shortened right lower extremity, and I do not know the history of hip fracture, so we will get an x-ray for that as well. I would also consider PE. The differential for this patient is still wide, and we will work to initiate stabilization and continue to rule out life-threatening causes of her abnormal vital signs and clinical presentation.  The patient will require admission to the hospital.  The pt did have Vicodin on her medication list although I do not have an up-to-date MAR so it was unclear last time she received it, and her pupils were symmetric but smallish bilaterally, so 2 mg of Narcan was attempted without any change in mental status.  ----------------------------------------- 1:31 PM on 11/27/2015 -----------------------------------------  CRITICAL CARE Performed by: Sharma CovertNorman, Anne-Caroline  Total critical care time: 60 minutes  Critical care time was exclusive of separately billable procedures and treating other patients.  Critical care was necessary to treat or prevent imminent or life-threatening deterioration.  Critical care was time spent personally by me on the following activities: development of treatment plan with patient and/or surrogate as well as nursing, discussions with consultants, evaluation of patient's response to treatment, examination of patient, obtaining history from patient or surrogate, ordering  and performing treatments and interventions, ordering and review of laboratory studies, ordering and review of radiographic studies, pulse oximetry and re-evaluation of patient's condition.   ----------------------------------------- 1:31 PM on 11/27/2015 -----------------------------------------  At this time, the patient continues to protect her airway but she is still hypotensive. She has received 150 cc of fluid so we will give her a full 500 and reevaluate her. Her heart rate has improved to the low 100s. The nurse reports a minimal amount of very thick urine return with catheterization that was foul-smelling. I am concerned that this may be the source of the patient's sepsis, and the nurses hanging antibiotics at this time. It has been reported to me by the registration personnel that the patient's daughter is on her way.  The patient has a pH of 7.31 with a PCO2 of 31 and PO2 of 48. This gas and her clinical picture are most indicative of a metabolic process, and since she is protecting her airway, I do not think she would benefit from BiPAP or intubation at this time. She needs her increased respiratory drive to blow off some of her acidosis. Her repeat blood sugar is also reassuring at 204. Her lactic acid is 8.2.  The patient's imaging at this time does not show any acute findings; her chest, abdomen and pelvis x-rays do not show acute processes. I'm awaiting the results of her CT scan.  ----------------------------------------- 1:47 PM on 11/27/2015 -----------------------------------------  After 1500 cc, the patient's blood pressures improved to the high 90s and her heart rate has been as low as 88. She is starting to have some groaning and movement of the extremities. The patient's daughter in [place] is at the bedside, and I have confirmed with her that the patient is DNR/DNI and would not want intubation, shocks, or CPR if her heart were to stop or if she were to stop breathing. At this  time, the patient is clinically improving but still critically ill.  ----------------------------------------- 2:40 PM on 11/27/2015 -----------------------------------------  The remainder of the patient's labs have resulted in the patient has acute renal failure with a creatinine of greater than 5 as well as significant hypernatremia which are likely due to severe dehydration. Her potassium is 5.4, but she does not have ischemic changes that are concerning for hyperkalemic cardiac changes. Plan admission to the hospital.  ----------------------------------------- 3:29 PM on 11/27/2015 -----------------------------------------  I was called back to the room by the hospitalist and the pt has had an acute chance in her vital signs with hypotension 70's and tachy to 150's.  ICU hospitalist called. ----------------------------------------- 4:05 PM on 11/27/2015 -----------------------------------------  I spoken with the ICU hospitalist, who states that the patient is responding to her repeat 1 L IV fluid with blood pressure of 91/46 and heart rate which has come down to the 130s. He requests that I re-consult the admitting hospitalist.  I have spoken to the hospitalist for admission.  ED ECG REPORT I, Rockne Menghini, the attending physician, personally viewed and interpreted this ECG.   Date: 11/27/2015  EKG Time: 1522  Rate: 150  Rhythm: sinus tachycardia  Axis: normal  Intervals:none  ST&T Change: no STEMI  ____________________________________________  FINAL CLINICAL IMPRESSION(S) / ED DIAGNOSES  Final diagnoses:  Sepsis, due to unspecified organism [facility])  UTI (lower urinary tract infection)  Hypotension, unspecified hypotension type  Metabolic acidosis  Dehydration  Acute renal failure, unspecified acute renal failure type [facility])      NEW MEDICATIONS STARTED DURING THIS VISIT:  New Prescriptions   No medications on file     Rockne Menghini, MD 11/27/15  1441  Rockne Menghini, MD 11/27/15 1530  Rockne Menghini, MD 11/27/15 1608  Rockne Menghini, MD 11/27/15 1609

## 2015-11-27 NOTE — Consult Note (Addendum)
PULMONARY / CRITICAL CARE MEDICINE   Name: Katherine Bartlett MRN: [id number] DOB: 09/26/1930    ADMISSION DATE:  11/27/2015 CONSULTATION DATE: 11/27/15 Consulting Physician - Dr. Winona Bartlett    CHIEF COMPLAINT:   Reason for consult -   AMS, Septic shock (?UTI), hypernatremia, Impending respiratory failure   HISTORY OF PRESENT ILLNESS  82 year old female past medical history of dementia, COPD, recent hypertension, osteoporosis, recent admission to the hospital after several mechanical falls with lumbar compression fracture and repeat fractures in June 2017, readmitted back to the hospital for unresponsiveness, septic shock, lactic acidosis, impending respiratory failure, therefore PCCM was consulted.  Initially called by ER physician about this patient, stating that patient was mildly responsive, but in no impending respiratory failure, she received 2.5 L of fluid resuscitation, and was on her third liter when her blood pressures started to improve and systolics in the 70s, she had a lactic acid initially of 8.3, repeat lactic acid was 5.8, at that point given the improvement in the patient's blood pressure response to fluids, ER physician stated that hospitalists would admit patient, and if there are any test, do a consult CCM.  Shortly after evaluation by hospitalist, patient was noted to be in septic shock, requiring low dose pressors, only responsive to sternal rub, and CCM was consulted. Upon my evaluation, the patient with open mouth breathing, not responding to verbal stimuli, grimacing to sternal rub only. Spoke with patient's healthcare power of attorney who is her daughter over the phone and in person, who stated that Patient with declining health status over the past couple of months, multiple falls, dementia, and at this time she wants medical treatment with DO NOT RESUSCITATE status. Further history is as follows, she was recently admitted to [facility], discharged in mid June,  transferred to [facility] healthcare facility over the past 2 weeks, has had worsening dementia, but still oriented to self, noted to have shortness of breath and wheezing on Friday, and today nursing staff found the patient mild responsive and hypoxic, she was placed on nasal cannula and EMS was called, difficult to obtain blood pressure per EMS records. On arrival to the ED she was given 2.5 L of normal saline as part of IV fluid resuscitation, she was noted to have a significant hypernatremia and significant worsening renal function.      SIGNIFICANT EVENTS  7/7>>Recently transferred to [facility] nursing facility, decreased appetite over the past week, increased shortness of breath, worsening dementia over the last couple of days, found unresponsive today with severe hyponatremia and dehydration  PAST MEDICAL HISTORY    :  Past Medical History  Diagnosis Date  . Dementia   . Osteoporosis   . Hypertension   . COPD (chronic obstructive pulmonary disease) [facility](HCC)    Past Surgical History  Procedure Laterality Date  . Back surgery     Prior to Admission medications   Medication Sig Start Date End Date Taking? Authorizing Provider  acetaminophen (TYLENOL) 500 MG tablet Take 500 mg by mouth every 4 (four) hours as needed for mild pain or moderate pain. *Do not exceed 3 G in 24 hours*   Yes Historical Provider, MD  albuterol (PROVENTIL HFA;VENTOLIN HFA) 108 (90 Base) MCG/ACT inhaler Inhale 2 puffs into the lungs every 6 (six) hours as needed for wheezing or shortness of breath.   Yes Historical Provider, MD  alendronate (FOSAMAX) 70 MG tablet Take 70 mg by mouth once a week. Take with a full glass of water on an empty  stomach.   Yes Historical Provider, MD  aspirin 81 MG tablet Take 81 mg by mouth daily.   Yes Historical Provider, MD  atenolol (TENORMIN) 25 MG tablet Take 25 mg by mouth daily.   Yes Historical Provider, MD  bisacodyl (DULCOLAX) 5 MG EC tablet Take 1 tablet (5 mg total) by mouth  daily as needed for moderate constipation. 11/09/15  Yes Wyatt Haste, MD  Calcium Carbonate-Vitamin D (CALCIUM 600+D) 600-400 MG-UNIT tablet Take 1 tablet by mouth 2 (two) times daily.   Yes Historical Provider, MD  docusate sodium (COLACE) 100 MG capsule Take 1 capsule (100 mg total) by mouth 2 (two) times daily. 11/09/15  Yes Wyatt Haste, MD  ipratropium-albuterol (DUONEB) 0.5-2.5 (3) MG/3ML SOLN Take 3 mLs by nebulization every 6 (six) hours as needed. For shortness of breath/wheezing.   Yes Historical Provider, MD  tiotropium (SPIRIVA) 18 MCG inhalation capsule Place 18 mcg into inhaler and inhale daily.   Yes Historical Provider, MD  Vitamin D, Ergocalciferol, (DRISDOL) 50000 units CAPS capsule Take 1 capsule (50,000 Units total) by mouth every 7 (seven) days. 11/09/15  Yes Wyatt Haste, MD  HYDROcodone-acetaminophen (NORCO/VICODIN) 5-325 MG tablet Take 1-2 tablets by mouth every 4 (four) hours as needed for moderate pain. 11/09/15   Wyatt Haste, MD   No Known Allergies   FAMILY HISTORY   History reviewed. No pertinent family history.    SOCIAL HISTORY    reports that she has never smoked. She does not have any smokeless tobacco history on file. She reports that she does not drink alcohol or use illicit drugs.  Review of Systems  Unable to perform ROS: critical illness      VITAL SIGNS    Temp:  [95 F (35 C)-96.8 F (36 C)] 96.8 F (36 C) (07/09 1728) Pulse Rate:  [36-151] 36 (07/09 1728) Resp:  [27-41] 30 (07/09 1728) BP: (62-109)/(39-78) 87/58 mmHg (07/09 1728) SpO2:  [94 %-100 %] 100 % (07/09 1728) Weight:  [113 lb (51.256 kg)-119 lb 14.9 oz (54.4 kg)] 119 lb 14.9 oz (54.4 kg) (07/09 1713) HEMODYNAMICS:   VENTILATOR SETTINGS:   INTAKE / OUTPUT:  Intake/Output Summary (Last 24 hours) at 11/27/15 1806 Last data filed at 11/27/15 1619  Gross per 24 hour  Intake   4000 ml  Output    500 ml  Net   3500 ml       PHYSICAL EXAM   Physical Exam   Constitutional:  Critically ill appearing, non responsive  HENT:  Head: Atraumatic.  Right Ear: External ear normal.  Left Ear: External ear normal.  Eyes: Pupils are equal, round, and reactive to light.  Neck: Neck supple.  Cardiovascular:  Tachycardic  Pulmonary/Chest: She is in respiratory distress. She has rales. She exhibits no tenderness.  Coarse upper airway sounds, poor airentry, no use of accessory muscles.   Abdominal: Soft. Bowel sounds are normal. She exhibits no distension. There is tenderness.  Diffuse abdominal tenderness, grimace to palpation  Genitourinary:  Foley cath - thick whitish fluid with layering the foley bag  Musculoskeletal: She exhibits edema.  Neurological:  Awake, does not track, not following commands,   Skin: Skin is dry. She is not diaphoretic.  Nursing note and vitals reviewed.      LABS   LABS:  CBC  Recent Labs Lab 11/27/15 1248 11/27/15 1734  WBC 20.0* 22.2*  HGB 17.0* 13.6  HCT 51.8* 43.1  PLT 368 221   Coag's  Recent Labs  Lab 11/27/15 1554  APTT <24*  INR 1.73   BMET  Recent Labs Lab 11/27/15 1330  NA 164*  K 5.4*  CL 129*  CO2 14*  BUN 156*  CREATININE 5.80*  GLUCOSE 209*   Electrolytes  Recent Labs Lab 11/27/15 1330  CALCIUM 8.0*   Sepsis Markers  Recent Labs Lab 11/27/15 1248 11/27/15 1554  LATICACIDVEN 8.2* 5.8*   ABG  Recent Labs Lab 11/27/15 1704  PHART 7.29*  PCO2ART 29*  PO2ART 127*   Liver Enzymes  Recent Labs Lab 11/27/15 1330  AST 63*  ALT 56*  ALKPHOS 125  BILITOT 0.9  ALBUMIN 2.5*   Cardiac Enzymes  Recent Labs Lab 11/27/15 1330  TROPONINI 0.09*   Glucose  Recent Labs Lab 11/27/15 1306 11/27/15 1708  GLUCAP 204* 203*     No results found for this or any previous visit (from the past 240 hour(s)).   Current facility-administered medications:  .  0.9 %  sodium chloride infusion, , Intravenous, Continuous, Katharina Caper, MD, Last Rate: 200 mL/hr at  11/27/15 1743, 1,000 mL at 11/27/15 1743 .  acetaminophen (TYLENOL) tablet 650 mg, 650 mg, Oral, Q6H PRN **OR** acetaminophen (TYLENOL) suppository 650 mg, 650 mg, Rectal, Q6H PRN, Katharina Caper, MD .  heparin injection 5,000 Units, 5,000 Units, Subcutaneous, Q8H, Rima Vaickute, MD .  ipratropium-albuterol (DUONEB) 0.5-2.5 (3) MG/3ML nebulizer solution 3 mL, 3 mL, Nebulization, Q6H PRN, Katharina Caper, MD .  norepinephrine (LEVOPHED) 4 mg in dextrose 5 % 250 mL (0.016 mg/mL) infusion, 0-40 mcg/min, Intravenous, Titrated, Katharina Caper, MD, Last Rate: 45 mL/hr at 11/27/15 1744, 12 mcg/min at 11/27/15 1744 .  ondansetron (ZOFRAN) tablet 4 mg, 4 mg, Oral, Q6H PRN **OR** ondansetron (ZOFRAN) injection 4 mg, 4 mg, Intravenous, Q6H PRN, Katharina Caper, MD .  Melene Muller ON 11/28/2015] piperacillin-tazobactam (ZOSYN) IVPB 3.375 g, 3.375 g, Intravenous, Q12H, Katharina Caper, MD .  sodium bicarbonate 150 mEq in dextrose 5 % 1,000 mL infusion, , Intravenous, Continuous, Katharina Caper, MD, Last Rate: 100 mL/hr at 11/27/15 1742 .  sodium chloride flush (NS) 0.9 % injection 3 mL, 3 mL, Intravenous, Q12H, Katharina Caper, MD .  tiotropium (SPIRIVA) inhalation capsule 18 mcg, 18 mcg, Inhalation, Daily, Katharina Caper, MD  IMAGING    Dg Pelvis 1-2 Views  11/27/2015  CLINICAL DATA:  Pt presents to ED via EMS from [facility] where SNF staff report pt was found 40 min ago unresponsive with increased respirations and clammy. Staff report yesterday pt was AANDO to self only. Hx frequent UTIs, se.*comment was truncated* EXAM: PELVIS - 1-2 VIEW COMPARISON:  Plain film 09/06/2015 FINDINGS: Remote RIGHT inferior pubic ramus fracture. No evidence acute fracture or dislocation. Bladder calculus noted. IMPRESSION: No acute fracture or dislocation. Remote RIGHT inferior pubic ramus fracture. Electronically Signed   By: Genevive Bi M.D.   On: 11/27/2015 13:30   Dg Abd 1 View  11/27/2015  CLINICAL DATA:  Found  unresponsive. EXAM: ABDOMEN - 1 VIEW COMPARISON:  CT abdomen FINDINGS: No dilated loops large or small bowel. Moderate volume stool in the rectum. Large bladder calculus noted. No aggressive osseous lesion. Degenerate changes spine. IMPRESSION: No evidence of bowel obstruction. Electronically Signed   By: Genevive Bi M.D.   On: 11/27/2015 13:29   Ct Head Wo Contrast  11/27/2015  CLINICAL DATA:  Unresponsive EXAM: CT HEAD WITHOUT CONTRAST TECHNIQUE: Contiguous axial images were obtained from the base of the skull through the vertex without intravenous contrast. COMPARISON:  11/06/2015  FINDINGS: No evidence of parenchymal hemorrhage or extra-axial fluid collection. No mass lesion, mass effect, or midline shift. No CT evidence of acute infarction. Subcortical white matter and periventricular small vessel ischemic changes. Intracranial atherosclerosis. Old left thalamic and right basal ganglia lacunar infarcts. Global cortical and central atrophy. Secondary ventricular prominence. The visualized paranasal sinuses are essentially clear. The mastoid air cells are unopacified. No evidence of calvarial fracture. IMPRESSION: No evidence of acute intracranial abnormality. Atrophy with small vessel ischemic changes. Old lacunar infarcts, as above. Electronically Signed   By: Charline Bills M.D.   On: 11/27/2015 14:22   Dg Chest Port 1 View  11/27/2015  CLINICAL DATA:  Unresponsive EXAM: PORTABLE CHEST 1 VIEW COMPARISON:  11/06/2015 FINDINGS: Normal heart size. Lungs clear. No pneumothorax. No pleural effusion. Chronic right-sided rib deformities. IMPRESSION: No active disease. Electronically Signed   By: Jolaine Click M.D.   On: 11/27/2015 13:27      Indwelling Urinary Catheter continued, requirement due to   Reason to continue Indwelling Urinary Catheter for strict Intake/Output monitoring for hemodynamic instability   Central Line continued, requirement due to   Reason to continue [employer]  Monitoring of central venous pressure or other hemodynamic parameters   Ventilator continued, requirement due to, resp failure    Ventilator Sedation RASS 0 to -2   Cultures: BCx2 7/9> UC 7/9> Sputum  Antibiotics: Zosyn 7/9 Vanc 7/9>  Lines: PIVs  Studies: Ct head 7/9 - no acute findings  ASSESSMENT/PLAN  82 year old with past medical history of dementia, COPD, hypertension, osteoporosis, now with severe sepsis, metabolic acidosis, hypernatremia, metabolic encephalopathy, acute renal failure, impending respiratory failure, septic shock, possibly due to urinary tract infection and dehydration.  SevereSeptic shock Multiorgan system failure Metabolic acidosis Lactic acidosis Hypernatremia Hyperkalemia Troponin Elevation Metabolic encephalopathy Dehydration Acute renal failure Urinary tract infection Leukocytosis   Plan: -I have spoken with the healthcare power of attorney in great detail, overall patient with worsen clinical condition over the past couple months, with multiple falls and increasing dementia with decline, lower level of independence, recently admitted to [facility] and then transferred to [facility] nursing facility now with severe sepsis/renal failure/hypernatremia/dehydration and poor clinicl prognosis. Daughter stated that at this time she does not want any aggressive measures. We discussed several options for this patient in great detail, and daughter agreed on the following: DO NOT RESUSCITATE, no central line placement, no form of dialysis, no antiarrhythmics, no defibrillation, do not use multiple pressors, no multiple lab draws; continue with medical treatment which includes IV fluid resuscitation, antibiotics, pain management, levophed to max of , okay to give small fluid boluses to assist with SBP. -Continue with vancomycin and Zosyn, follow up on cultures -Appreciate input from nephrology -maintain MAP>55 or as close to as  possible with the above measures.  -Continue with sodium bicarbonate in D5; continue this until metabolic acidosis corrects, or there is a change in treatment plan. -Check BMP at 10 PM tonight, and again in the morning, recheck ABG with a.m. Labs. -Repeat sepsis assessment completed.   RN Witness - Katie C.   I have personally obtained a history, examined the patient, evaluated laboratory and imaging results, formulated the assessment and plan and placed orders.  The Patient requires high complexity decision making for assessment and support, frequent evaluation and titration of therapies, application of advanced monitoring technologies and extensive interpretation of multiple databases. Critical Care Time devoted to patient care services described in this note is 55 minutes.  Overall, patient is critically ill, prognosis is guarded. Patient at high risk for cardiac arrest and death.   Stephanie Acre, MD [facility] Pager [phone] (please enter 7-digits) On Call Pager - [phone] (please enter 7-digits)      11/27/2015, 6:06 PM  Note: This note was prepared with Dragon dictation along with smaller phrase technology. Any transcriptional errors that result from this process are unintentional.

## 2015-11-27 NOTE — Consult Note (Signed)
Pharmacy Antibiotic Note  Katherine Bartlett is a 82 y.o. female admitted on 11/27/2015 with sepsis/UTI?Marland Kitchen.  Pharmacy has been consulted for vancomycin and zosyn dosing.  Plan: patient has been placed on CRRT due to renal failure and septic shock. zosyn 3.375g q 8 hours EI infusion. vancomycin 1g q 24 hours. trough prior to the 3rd dose. goal 15-25. Pharmacy will continue to monitor for needed chanages with medications based on renal function/CRRT  Height: 5\' 5"  (165.1 cm) Weight: 119 lb 14.9 oz (54.4 kg) IBW/kg (Calculated) : 57  Temp (24hrs), Avg:96.1 F (35.6 C), Min:95 F (35 C), Max:96.8 F (36 C)   Recent Labs Lab 11/27/15 1248 11/27/15 1330 11/27/15 1554 11/27/15 1734  WBC 20.0*  --   --  22.2*  CREATININE  --  5.80*  --   --   LATICACIDVEN 8.2*  --  5.8*  --     Estimated Creatinine Clearance: 6.3 mL/min (by C-G formula based on Cr of 5.8).    No Known Allergies  Antimicrobials this admission: vancomycin 7/9 >>  zosyn 7/9 >>   Dose adjustments this admission:   Microbiology results: 7/9 BCx: pending 7/9 UCx: pending   7/9 MRSA PCR: pending  Thank you for allowing pharmacy to be a part of this patient's care.  Olene FlossMelissa D Daimon Kean, Pharm.D Clinical Pharmacist 11/27/2015 6:24 PM

## 2015-11-27 NOTE — Progress Notes (Signed)
Call from lab of results of Chloride of >130 and Na 161. Dr. Cherylann RatelLateef at bedside, advised of these critical values. Per Dr. Cherylann RatelLateef, discontinue NS and increase Bicarb to 200 ml/hr.

## 2015-11-27 NOTE — H&P (Signed)
[facility] - [place] [facility]   PATIENT NAME: Katherine Bartlett    MR#:  [id number]  DATE OF BIRTH:  1931-03-30  DATE OF ADMISSION:  11/27/2015  PRIMARY CARE PHYSICIAN: Jaclyn Shaggy, MD   REQUESTING/REFERRING PHYSICIAN:   CHIEF COMPLAINT:  No chief complaint on file.   HISTORY OF PRESENT ILLNESS: Katherine Bartlett  is a 82 y.o. female with a known history of dementia, COPD, essential hypertension, osteoporosis, who was admitted to the hospital for fall, lumbar compression fracture, repeat fractures in June 2017, presents to the hospital with complaints of shortness of breath. Apparently patient has been in [facility] for the past 2 weeks, patient's daughter saw her on Friday, she was wheezing and short of breath, however, was hot and humid, she felt that it could have been related to humidity and heat, however, requested breathing treatments from the facility, which was given. Today, however, the daughter was called by the nursing home, telling her that the patient is being brought to emergency room due to respiratory distress. In emergency room, she was noted to be hypoxic, responding to oxygen through nasal cannula, she was also noted to be hypotensive, she was given at least 3 L of normal saline with minimal improvement of her blood pressure readings, her heart rate also is ranging between 130s to 150s. In emergency room. Hospitalist services were contacted for admission. Patient's labs  revealed acute renal failure, acidosis, lactic acid level of above 8, hyperkalemia, leukocytosis, pyuria, likely urinary tract infection. Patient is not able to provide review of systems due to mental status change  PAST MEDICAL HISTORY:   Past Medical History  Diagnosis Date  . Dementia   . Osteoporosis   . Hypertension   . COPD (chronic obstructive pulmonary disease) (HCC)     PAST SURGICAL HISTORY: Past Surgical History  Procedure Laterality Date  . Back surgery       SOCIAL HISTORY:  Social History  Substance Use Topics  . Smoking status: Never Smoker   . Smokeless tobacco: Not on file  . Alcohol Use: No    FAMILY HISTORY: Unable to review as patient is unresponsive  DRUG ALLERGIES: No Known Allergies  Review of Systems  Unable to perform ROS: mental acuity    MEDICATIONS AT HOME:  Prior to Admission medications   Medication Sig Start Date End Date Taking? Authorizing Provider  acetaminophen (TYLENOL) 500 MG tablet Take 500 mg by mouth every 4 (four) hours as needed for mild pain or moderate pain. *Do not exceed 3 G in 24 hours*   Yes Historical Provider, MD  albuterol (PROVENTIL HFA;VENTOLIN HFA) 108 (90 Base) MCG/ACT inhaler Inhale 2 puffs into the lungs every 6 (six) hours as needed for wheezing or shortness of breath.   Yes Historical Provider, MD  alendronate (FOSAMAX) 70 MG tablet Take 70 mg by mouth once a week. Take with a full glass of water on an empty stomach.   Yes Historical Provider, MD  aspirin 81 MG tablet Take 81 mg by mouth daily.   Yes Historical Provider, MD  atenolol (TENORMIN) 25 MG tablet Take 25 mg by mouth daily.   Yes Historical Provider, MD  bisacodyl (DULCOLAX) 5 MG EC tablet Take 1 tablet (5 mg total) by mouth daily as needed for moderate constipation. 11/09/15  Yes Wyatt Haste, MD  Calcium Carbonate-Vitamin D (CALCIUM 600+D) 600-400 MG-UNIT tablet Take 1 tablet by mouth 2 (two) times daily.   Yes Historical Provider, MD  docusate sodium (COLACE) 100 MG capsule Take 1 capsule (100 mg total) by mouth 2 (two) times daily. 11/09/15  Yes Wyatt Haste, MD  ipratropium-albuterol (DUONEB) 0.5-2.5 (3) MG/3ML SOLN Take 3 mLs by nebulization every 6 (six) hours as needed. For shortness of breath/wheezing.   Yes Historical Provider, MD  tiotropium (SPIRIVA) 18 MCG inhalation capsule Place 18 mcg into inhaler and inhale daily.   Yes Historical Provider, MD  Vitamin D, Ergocalciferol, (DRISDOL) 50000 units CAPS capsule Take 1  capsule (50,000 Units total) by mouth every 7 (seven) days. 11/09/15  Yes Wyatt Haste, MD  HYDROcodone-acetaminophen (NORCO/VICODIN) 5-325 MG tablet Take 1-2 tablets by mouth every 4 (four) hours as needed for moderate pain. 11/09/15   Wyatt Haste, MD      PHYSICAL EXAMINATION:   VITAL SIGNS: Blood pressure 65/47, pulse 90, temperature 96.8 F (36 C), temperature source Core (Comment), resp. rate 27, height  (1.651 m), weight 54.4 kg (119 lb 14.9 oz), SpO2 94 %.  GENERAL:  82 y.o.-year-old patient lying in the bed In moderate to severe  distress, pale, sunken eyes, breathing heavily, but dyspneic .  EYES: Pupils equal, round,  2 mm, reactive to light and accommodation. No scleral icterus. Extraocular muscles intact.  HEENT: Head atraumatic, normocephalic. Oropharynx and nasopharynx clear. . Dry oral mucosa NECK:  Supple, no jugular venous distention. No thyroid enlargement, no tenderness.  LUNGS: Normal breath sounds bilaterally, no wheezing, rales,rhonchi or crepitations, intermittent use of  muscles of respiration.  CARDIOVASCULAR: S1,S2, rhythm was regular, tachycardic . No murmurs, rubs, or gallops.  ABDOMEN is soft, tender diffusely to palpation, patient groans to palpation in all quadrants, some voluntary guarding, no rebound  nondistended. Bowel sounds present, diminished. No organomegaly or mass.  EXTREMITIES: No pedal edema, cyanosis, or clubbing.  NEUROLOGIC: Cranial nerves II through XII are grossly  intact with grimacing . Muscle strength , not moving upper or lower extremities with painful stimuli. Sensation Unable to evaluate this patient is unresponsive. Gait is not checked.  PSYCHIATRIC: The patient isVery somnolent, not able to assess orientation, not responding to painful stimuli.  SKIN: No obvious rash, lesion, or ulcer.   LABORATORY PANEL:   CBC  Recent Labs Lab 11/27/15 1248  WBC 20.0*  HGB 17.0*  HCT 51.8*  PLT 368  MCV 91.4  MCH 30.0  MCHC 32.8  RDW  15.7*  LYMPHSABS 0.8*  MONOABS 1.0*  EOSABS 0.2  BASOSABS 0.0   ------------------------------------------------------------------------------------------------------------------  Chemistries   Recent Labs Lab 11/27/15 1330  NA 164*  K 5.4*  CL 129*  CO2 14*  GLUCOSE 209*  BUN 156*  CREATININE 5.80*  CALCIUM 8.0*  AST 63*  ALT 56*  ALKPHOS 125  BILITOT 0.9   ------------------------------------------------------------------------------------------------------------------  Cardiac Enzymes  Recent Labs Lab 11/27/15 1330  TROPONINI 0.09*   ------------------------------------------------------------------------------------------------------------------  RADIOLOGY: Dg Pelvis 1-2 Views  11/27/2015  CLINICAL DATA:  Pt presents to ED via EMS from [facility] where SNF staff report pt was found 40 min ago unresponsive with increased respirations and clammy. Staff report yesterday pt was AANDO to self only. Hx frequent UTIs, se.*comment was truncated* EXAM: PELVIS - 1-2 VIEW COMPARISON:  Plain film 09/06/2015 FINDINGS: Remote RIGHT inferior pubic ramus fracture. No evidence acute fracture or dislocation. Bladder calculus noted. IMPRESSION: No acute fracture or dislocation. Remote RIGHT inferior pubic ramus fracture. Electronically Signed   By: Genevive Bi M.D.   On: 11/27/2015 13:30   Dg Abd 1 View  11/27/2015  CLINICAL DATA:  Found unresponsive. EXAM: ABDOMEN - 1 VIEW COMPARISON:  CT abdomen FINDINGS: No dilated loops large or small bowel. Moderate volume stool in the rectum. Large bladder calculus noted. No aggressive osseous lesion. Degenerate changes spine. IMPRESSION: No evidence of bowel obstruction. Electronically Signed   By: Genevive BiStewart  Edmunds M.D.   On: 11/27/2015 13:29   Ct Head Wo Contrast  11/27/2015  CLINICAL DATA:  Unresponsive EXAM: CT HEAD WITHOUT CONTRAST TECHNIQUE: Contiguous axial images were obtained from the base of the skull through the  vertex without intravenous contrast. COMPARISON:  11/06/2015 FINDINGS: No evidence of parenchymal hemorrhage or extra-axial fluid collection. No mass lesion, mass effect, or midline shift. No CT evidence of acute infarction. Subcortical white matter and periventricular small vessel ischemic changes. Intracranial atherosclerosis. Old left thalamic and right basal ganglia lacunar infarcts. Global cortical and central atrophy. Secondary ventricular prominence. The visualized paranasal sinuses are essentially clear. The mastoid air cells are unopacified. No evidence of calvarial fracture. IMPRESSION: No evidence of acute intracranial abnormality. Atrophy with small vessel ischemic changes. Old lacunar infarcts, as above. Electronically Signed   By: Charline BillsSriyesh  Krishnan M.D.   On: 11/27/2015 14:22   Dg Chest Port 1 View  11/27/2015  CLINICAL DATA:  Unresponsive EXAM: PORTABLE CHEST 1 VIEW COMPARISON:  11/06/2015 FINDINGS: Normal heart size. Lungs clear. No pneumothorax. No pleural effusion. Chronic right-sided rib deformities. IMPRESSION: No active disease. Electronically Signed   By: Jolaine ClickArthur  Hoss M.D.   On: 11/27/2015 13:27    EKG: Orders placed or performed during the hospital encounter of 11/27/15  . ED EKG  . ED EKG  . ED EKG 12-Lead  . ED EKG 12-Lead  . EKG 12-Lead  . EKG 12-Lead  . EKG 12-Lead  . EKG 12-Lead  EKG reveals sinus tachycardia at rate of 10 9 bpm, normal axis, no acute ST-T changes   IMPRESSION AND PLAN:  Active Problems:   Sepsis (HCC)   Acute pyelonephritis   Metabolic acidosis   Lactic acidosis   Hypernatremia   Hyperkalemia   Hyperglycemia   Elevated troponin   Leukocytosis   Dehydration   Coagulopathy (HCC) #1. Sepsis with end organ damage, thick shock, continue patient on IV fluids, including bicarbonate drip, continue Levophed, blood cultures are taken, as well as urinary cultures, initiate patient on broad-spectrum antibiotic therapy, follow culture results   #2   acute pyLonephritis, urine cultures are taken, now patient is on Zosyn, follow culture results #3. Acute renal failure, creatinine level of 5.8, up from 0.6 in June 2017, continue IV fluids, allergies is consulted for urgent evaluation, likely CRRT due to hyperkalemia, intensivist to place temporary dialysis catheter, discussed with Dr. Cherylann RatelLateef and Dr. Dema SeverinMungAL #4. Hypernatremia, IV fluids with normal saline until intravascular REpleteD, follow sodium level closely #5. Metabolic acidosis, continue IV fluids, bicarbonate for now, CRRT is requested, discussed with nephrologist #6. Elevated troponin, likely demand ischemia, follow troponin levels every 6 hours, supportive therapy #7. Leukocytosis, follow with antibiotic therapy #8 hyperkalemia, bicarbonate for now, CRRT is requested, discussed his nephrologist as well as intensivist as above   All the records are reviewed and case discussed with ED provider. Management plans discussed with the patient, family and they are in agreement.  CODE STATUS:    Code Status Orders        Start     Ordered   11/27/15 1709  Full code   Continuous     11/27/15 1708    Code Status  History    Date Active Date Inactive Code Status Order ID Comments User Context   11/07/2015 12:27 AM 11/09/2015 10:42 PM Full Code [id number]  Delfino Lovett, MD Inpatient   07/14/2015 11:35 PM 07/18/2015 12:15 AM Full Code [id number]  Milagros Loll, MD ED       TOTAL TIME TAKING CARE OF THIS PATIEnt 50 MINUTES.    Katharina Caper M.D on 11/27/2015 at 5:28 PM  Between 7am to 6pm - Pager - [phone] After 6pm go to [email or web address] - password [email or web address] [facility]  [place] [facility]  Office  [phone]  CC: Primary care physician; Jaclyn Shaggy, MD

## 2015-11-27 NOTE — Progress Notes (Signed)
eLink Physician-Brief Progress Note Patient Name: Servando SalinaFrances A Castelluccio DOB: 1931-02-27 MRN: [id number]   Date of Service  11/27/2015  HPI/Events of Note  82 yr old NHR presented to the ED with SOB and found to have septic shock with associated acute renal failure.  UTI appears to be the source.  Labs notable for Na 164, creat 5.8, k 5.4, and lactate of 8.2 on presentation.  Volume resuscitation has been given with decrease in lactate to 5.8.  Broad spectrum abx have been initiated to cover health care acquired organisms.  Patient has been admitted to the ICU for further management.   eICU Interventions  CVC to be placed and vasopressors started as needed to help maintain MAP of 65. Intensivist has been consulted     Intervention Category Evaluation Type: New Patient Evaluation  Henry RusselSMITH, Mackenzy Eisenberg, P 11/27/2015, 5:52 PM

## 2015-11-27 NOTE — Consult Note (Addendum)
PHARMACY - CRITICAL CARE PROGRESS NOTE  Pharmacy Consult for medications management pt on CRRT Indication: CRRT   No Known Allergies  Patient Measurements: Height: 5\' 5"  (165.1 cm) Weight: 119 lb 14.9 oz (54.4 kg) IBW/kg (Calculated) : 57 Adjusted Body Weight:   Vital Signs: Temp: 96.8 F (36 C) (07/09 1728) Temp Source: Core (Comment) (07/09 1611) BP: 87/58 mmHg (07/09 1728) Pulse Rate: 36 (07/09 1728) Intake/Output from previous day:   Intake/Output from this shift: Total I/O In: 4000 [I.V.:4000] Out: 500 [Urine:500] Vent settings for last 24 hours:    Labs:  Recent Labs  11/27/15 1248 11/27/15 1330 11/27/15 1554 11/27/15 1734  WBC 20.0*  --   --  22.2*  HGB 17.0*  --   --  13.6  HCT 51.8*  --   --  43.1  PLT 368  --   --  221  APTT  --   --  <24*  --   INR  --   --  1.73  --   CREATININE  --  5.80*  --   --   ALBUMIN  --  2.5*  --   --   PROT  --  5.7*  --   --   AST  --  63*  --   --   ALT  --  56*  --   --   ALKPHOS  --  125  --   --   BILITOT  --  0.9  --   --    Estimated Creatinine Clearance: 6.3 mL/min (by C-G formula based on Cr of 5.8).   Recent Labs  11/27/15 1306 11/27/15 1708  GLUCAP 204* 203*    Microbiology: No results found for this or any previous visit (from the past 720 hour(s)).  Medications:  Scheduled:  . heparin  5,000 Units Subcutaneous Q8H  . piperacillin-tazobactam (ZOSYN)  IV  3.375 g Intravenous Q8H  . sodium chloride flush  3 mL Intravenous Q12H  . tiotropium  18 mcg Inhalation Daily  . [START ON 11/28/2015] vancomycin  1,000 mg Intravenous Q24H    Assessment: Pt is a 82 year old female who presents in septic shock. Pt is being initated on CRRT. Pharmacy consulted to review medications daily for needed adjustments.  Goal of Therapy:    Plan:  All medications reviewed, abx have  Been adjusted for CRRT (see separate abx note). No other medications are in need of adjustment at this time. Pharmacy will continue to  monitor. Thank you for allowing pharmacy to be a part of this patient's care.  Katherine Bartlett, Pharm.D Clinical Pharmacist 11/27/2015,6:34 PM  11/28/2015 0034 CRRT order discontinued. Zosyn changed to 3.375 gm IV Q12H EI and vancomycin changed to 1 gm IV Q72H starting 11/29/15 at 1300. Will still check random 11/29/15 at 1230. Katherine Bartlett, [place]arm[place]., BCPS, Clinical Pharmacist

## 2015-11-27 NOTE — Progress Notes (Signed)
   11/27/15 1820  Clinical Encounter Type  Visited With Family  Visit Type Spiritual support;Patient actively dying  Referral From Nurse  Consult/Referral To Chaplain  Spiritual Encounters  Spiritual Needs Prayer  Stress Factors  Patient Stress Factors None identified  Family Stress Factors Family relationships;Major life changes  Patient trying to get in touch with family for support and to help her to make decisions.

## 2015-11-28 DIAGNOSIS — N10 Acute tubulo-interstitial nephritis: Secondary | ICD-10-CM

## 2015-11-28 DIAGNOSIS — Z515 Encounter for palliative care: Secondary | ICD-10-CM | POA: Insufficient documentation

## 2015-11-28 LAB — GLUCOSE, CAPILLARY
GLUCOSE-CAPILLARY: 139 mg/dL — AB (ref 65–99)
GLUCOSE-CAPILLARY: 145 mg/dL — AB (ref 65–99)
GLUCOSE-CAPILLARY: 153 mg/dL — AB (ref 65–99)
GLUCOSE-CAPILLARY: 153 mg/dL — AB (ref 65–99)
GLUCOSE-CAPILLARY: 189 mg/dL — AB (ref 65–99)
Glucose-Capillary: 132 mg/dL — ABNORMAL HIGH (ref 65–99)

## 2015-11-28 LAB — BASIC METABOLIC PANEL
ANION GAP: 9 (ref 5–15)
Anion gap: 7 (ref 5–15)
BUN: 115 mg/dL — AB (ref 6–20)
BUN: 94 mg/dL — AB (ref 6–20)
CHLORIDE: 122 mmol/L — AB (ref 101–111)
CHLORIDE: 115 mmol/L — AB (ref 101–111)
CO2: 28 mmol/L (ref 22–32)
CO2: 27 mmol/L (ref 22–32)
Calcium: 6.4 mg/dL — CL (ref 8.9–10.3)
Calcium: 6.5 mg/dL — ABNORMAL LOW (ref 8.9–10.3)
Creatinine, Ser: 3.23 mg/dL — ABNORMAL HIGH (ref 0.44–1.00)
Creatinine, Ser: 2.35 mg/dL — ABNORMAL HIGH (ref 0.44–1.00)
GFR calc Af Amer: 14 mL/min — ABNORMAL LOW (ref >60)
GFR calc Af Amer: 21 mL/min — ABNORMAL LOW (ref >60)
GFR calc non Af Amer: 12 mL/min — ABNORMAL LOW (ref >60)
GFR, EST NON AFRICAN AMERICAN: 18 mL/min — AB (ref >60)
GLUCOSE: 154 mg/dL — AB (ref 65–99)
Glucose, Bld: 161 mg/dL — ABNORMAL HIGH (ref 65–99)
POTASSIUM: 3 mmol/L — AB (ref 3.5–5.1)
POTASSIUM: 3 mmol/L — AB (ref 3.5–5.1)
SODIUM: 151 mmol/L — AB (ref 135–145)
Sodium: 157 mmol/L — ABNORMAL HIGH (ref 135–145)

## 2015-11-28 LAB — BLOOD GAS, ARTERIAL
ACID-BASE EXCESS: 7.2 mmol/L — AB (ref 0.0–3.0)
ALLENS TEST (PASS/FAIL): POSITIVE — AB
Bicarbonate: 29.7 mEq/L — ABNORMAL HIGH (ref 21.0–28.0)
FIO2: 0.28
O2 SAT: 99 %
PATIENT TEMPERATURE: 37
PCO2 ART: 34 mmHg (ref 32.0–48.0)
pH, Arterial: 7.55 — ABNORMAL HIGH (ref 7.350–7.450)
pO2, Arterial: 115 mmHg — ABNORMAL HIGH (ref 83.0–108.0)

## 2015-11-28 LAB — ALBUMIN: Albumin: 2 g/dL — ABNORMAL LOW (ref 3.5–5.0)

## 2015-11-28 LAB — CBC
HEMATOCRIT: 36.8 % (ref 35.0–47.0)
Hemoglobin: 12.3 g/dL (ref 12.0–16.0)
MCH: 30.1 pg (ref 26.0–34.0)
MCHC: 33.4 g/dL (ref 32.0–36.0)
MCV: 90 fL (ref 80.0–100.0)
Platelets: 195 10*3/uL (ref 150–440)
RBC: 4.09 MIL/uL (ref 3.80–5.20)
RDW: 15.3 % — AB (ref 11.5–14.5)
WBC: 14 10*3/uL — AB (ref 3.6–11.0)

## 2015-11-28 LAB — TROPONIN I: Troponin I: 0.14 ng/mL (ref <0.03)

## 2015-11-28 LAB — LACTIC ACID, PLASMA: Lactic Acid, Venous: 2.3 mmol/L (ref 0.5–1.9)

## 2015-11-28 LAB — MAGNESIUM: Magnesium: 2.3 mg/dL (ref 1.7–2.4)

## 2015-11-28 LAB — PHOSPHORUS: PHOSPHORUS: 2.1 mg/dL — AB (ref 2.5–4.6)

## 2015-11-28 LAB — HEMOGLOBIN A1C: Hgb A1c MFr Bld: 6.4 % — ABNORMAL HIGH (ref 4.0–6.0)

## 2015-11-28 MED ORDER — SODIUM CHLORIDE 0.9 % IV SOLN
1.0000 g | Freq: Once | INTRAVENOUS | Status: AC
  Administered 2015-11-28: 1 g via INTRAVENOUS
  Filled 2015-11-28: qty 10

## 2015-11-28 MED ORDER — POTASSIUM CHLORIDE 10 MEQ/100ML IV SOLN
10.0000 meq | INTRAVENOUS | Status: DC
  Filled 2015-11-28 (×6): qty 100

## 2015-11-28 MED ORDER — POTASSIUM PHOSPHATES 15 MMOLE/5ML IV SOLN
10.0000 mmol | Freq: Once | INTRAVENOUS | Status: AC
  Administered 2015-11-28: 10 mmol via INTRAVENOUS
  Filled 2015-11-28: qty 3.33

## 2015-11-28 MED ORDER — VANCOMYCIN HCL IN DEXTROSE 1-5 GM/200ML-% IV SOLN: 1000.0000 mg | INTRAVENOUS | Status: DC

## 2015-11-28 MED ORDER — CETYLPYRIDINIUM CHLORIDE 0.05 % MT LIQD
7.0000 mL | Freq: Two times a day (BID) | OROMUCOSAL | Status: DC
  Administered 2015-11-28 – 2015-11-29 (×3): 7 mL via OROMUCOSAL

## 2015-11-28 MED ORDER — DEXTROSE 5 % IV SOLN
INTRAVENOUS | Status: DC
  Administered 2015-11-28 – 2015-11-30 (×4): via INTRAVENOUS

## 2015-11-28 MED ORDER — SODIUM BICARBONATE 8.4 % IV SOLN: INTRAVENOUS | Status: DC

## 2015-11-28 MED ORDER — POTASSIUM CHLORIDE 10 MEQ/100ML IV SOLN
10.0000 meq | INTRAVENOUS | Status: DC
  Filled 2015-11-28 (×4): qty 100

## 2015-11-28 MED ORDER — PIPERACILLIN-TAZOBACTAM 3.375 G IVPB
3.3750 g | Freq: Two times a day (BID) | INTRAVENOUS | Status: DC
  Administered 2015-11-28 – 2015-11-30 (×5): 3.375 g via INTRAVENOUS
  Filled 2015-11-28 (×6): qty 50

## 2015-11-28 MED ORDER — SODIUM ACETATE 2 MEQ/ML IV SOLN
INTRAVENOUS | Status: DC
  Administered 2015-11-28: 09:00:00 via INTRAVENOUS
  Filled 2015-11-28 (×4): qty 1000

## 2015-11-28 MED ORDER — POTASSIUM CHLORIDE 10 MEQ/100ML IV SOLN
10.0000 meq | INTRAVENOUS | Status: AC
  Administered 2015-11-28 – 2015-11-29 (×4): 10 meq via INTRAVENOUS
  Filled 2015-11-28 (×4): qty 100

## 2015-11-28 MED ORDER — CHLORHEXIDINE GLUCONATE 0.12 % MT SOLN
15.0000 mL | Freq: Two times a day (BID) | OROMUCOSAL | Status: DC
  Administered 2015-11-28 – 2015-11-30 (×4): 15 mL via OROMUCOSAL
  Filled 2015-11-28 (×2): qty 15

## 2015-11-28 MED ORDER — POTASSIUM CHLORIDE 10 MEQ/100ML IV SOLN
10.0000 meq | INTRAVENOUS | Status: DC
  Filled 2015-11-28 (×2): qty 100

## 2015-11-28 NOTE — Progress Notes (Signed)
[facility]  ROUNDING NOTE   Subjective:   Tmin 96.4  Na 157 (157)  K 3 - being replaced  Sodium acetate @200 , D5W @100 , on norepinephrine  Objective:  Vital signs in last 24 hours:  Temp:  [95 F (35 C)-97.9 F (36.6 C)] 97.5 F (36.4 C) (07/10 0600) Pulse Rate:  [36-151] 82 (07/10 0600) Resp:  [20-41] 23 (07/10 0600) BP: (62-118)/(39-78) 93/46 mmHg (07/10 0600) SpO2:  [94 %-100 %] 100 % (07/10 0600) Weight:  [51.256 kg (113 lb)-54.8 kg (120 lb 13 oz)] 54.8 kg (120 lb 13 oz) (07/10 0500)  Weight change:  Filed Weights   11/27/15 1233 11/27/15 1713 11/28/15 0500  Weight: 51.256 kg (113 lb) 54.4 kg (119 lb 14.9 oz) 54.8 kg (120 lb 13 oz)    Intake/Output: I/O last 3 completed shifts: In: 7805.2 [I.V.:7755.2; IV Piggyback:50] Out: 1825 [Urine:1825]   Intake/Output this shift:     Physical Exam: General: Critically ill   Head: Normocephalic, atraumatic. Moist oral mucosal membranes  Eyes: Anicteric, PERRL  Neck: Supple, trachea midline  Lungs:  Clear to auscultation  Heart: tachycardia  Abdomen:  Soft, nontender  Extremities: No peripheral edema.  Neurologic: Nonfocal, moving all four extremities  Skin: No lesions       Basic Metabolic Panel:  Recent Labs Lab 11/27/15 1330 11/27/15 1734 11/27/15 2259 11/28/15 0519  NA 164* 161* 157* 157*  K 5.4* 5.1 3.8 3.0*  CL 129* >130* 128* 122*  CO2 14* 15* 21* 28  GLUCOSE 209* 196* 273* 154*  BUN 156* 143* 146* 115*  CREATININE 5.80* 4.89* 4.04* 3.23*  CALCIUM 8.0* 7.3* 6.6* 6.4*  MG  --   --   --  2.3  PHOS  --   --   --  2.1*    Liver Function Tests:  Recent Labs Lab 11/27/15 1330 11/28/15 0529  AST 63*  --   ALT 56*  --   ALKPHOS 125  --   BILITOT 0.9  --   PROT 5.7*  --   ALBUMIN 2.5* 2.0*    Recent Labs Lab 11/27/15 1330  LIPASE 36   No results for input(s): AMMONIA in the last 168 hours.  CBC:  Recent Labs Lab 11/27/15 1248 11/27/15 1734 11/28/15 0519  WBC  20.0* 22.2* 14.0*  NEUTROABS 18.0*  --   --   HGB 17.0* 13.6 12.3  HCT 51.8* 43.1 36.8  MCV 91.4 95.6 90.0  PLT 368 221 195    Cardiac Enzymes:  Recent Labs Lab 11/27/15 1330 11/27/15 1734 11/27/15 2259 11/28/15 0519  TROPONINI 0.09* 0.06* 0.08* 0.14*    BNP: Invalid input(s): POCBNP  CBG:  Recent Labs Lab 11/27/15 1708 11/27/15 2026 11/27/15 2349 11/28/15 0330 11/28/15 0726  GLUCAP 203* 258* 227* 153* 132*    Microbiology: Results for orders placed or performed during the hospital encounter of 11/27/15  Culture, blood (Routine x 2)     Status: None (Preliminary result)   Collection Time: 11/27/15 12:48 PM  Result Value Ref Range Status   Specimen Description BLOOD RIGHT ASSIST CONTROL  Final   Special Requests   Final    BOTTLES DRAWN AEROBIC AND ANAEROBIC 10CC AEROBIC, 5 CC ANAEROBIC   Culture NO GROWTH < 24 HOURS  Final   Report Status PENDING  Incomplete  Culture, blood (Routine x 2)     Status: None (Preliminary result)   Collection Time: 11/27/15 12:48 PM  Result Value Ref Range Status   Specimen Description BLOOD  LEFT ASSIST CONTROL  Final   Special Requests BOTTLES DRAWN AEROBIC AND ANAEROBIC 5 CC  Final   Culture NO GROWTH < 24 HOURS  Final   Report Status PENDING  Incomplete  MRSA PCR Screening     Status: None   Collection Time: 11/27/15  5:13 PM  Result Value Ref Range Status   MRSA by PCR NEGATIVE NEGATIVE Final    Comment:        The GeneXpert MRSA Assay (FDA approved for NASAL specimens only), is one component of a comprehensive MRSA colonization surveillance program. It is not intended to diagnose MRSA infection nor to guide or monitor treatment for MRSA infections.     Coagulation Studies:  Recent Labs  11/27/15 1554  LABPROT 20.2*  INR 1.73    Urinalysis:  Recent Labs  11/27/15 1316  COLORURINE YELLOW*  LABSPEC 1.014  PHURINE 8.0  GLUCOSEU NEGATIVE  HGBUR NEGATIVE  BILIRUBINUR NEGATIVE  KETONESUR TRACE*   PROTEINUR >500*  NITRITE NEGATIVE  LEUKOCYTESUR 2+*      Imaging: Dg Pelvis 1-2 Views  11/27/2015  CLINICAL DATA:  [facility] ED [facility] EMS [facility] where SNF staff report pt was found 40 min ago unresponsive with increased respirations and clammy. Staff report yesterday pt was AANDO to self only. Hx frequent UTIs, se.*comment was truncated* EXAM: PELVIS - 1-2 VIEW COMPARISON:  Plain film 09/06/2015 FINDINGS: Remote RIGHT inferior pubic ramus fracture. No evidence acute fracture or dislocation. Bladder calculus noted. IMPRESSION: No acute fracture or dislocation. Remote RIGHT inferior pubic ramus fracture. Electronically Signed   By: Genevive BiStewart  Edmunds M.D.   On: 11/27/2015 13:30   Dg Abd 1 View  11/27/2015  CLINICAL DATA:  Found unresponsive. EXAM: ABDOMEN - 1 VIEW COMPARISON:  CT abdomen FINDINGS: No dilated loops large or small bowel. Moderate volume stool in the rectum. Large bladder calculus noted. No aggressive osseous lesion. Degenerate changes spine. IMPRESSION: No evidence of bowel obstruction. Electronically Signed   By: Genevive BiStewart  Edmunds M.D.   On: 11/27/2015 13:29   Ct Head Wo Contrast  11/27/2015  CLINICAL DATA:  Unresponsive EXAM: CT HEAD WITHOUT CONTRAST TECHNIQUE: Contiguous axial images were obtained from the base of the skull through the vertex without intravenous contrast. COMPARISON:  11/06/2015 FINDINGS: No evidence of parenchymal hemorrhage or extra-axial fluid collection. No mass lesion, mass effect, or midline shift. No CT evidence of acute infarction. Subcortical white matter and periventricular small vessel ischemic changes. Intracranial atherosclerosis. Old left thalamic and right basal ganglia lacunar infarcts. Global cortical and central atrophy. Secondary ventricular prominence. The visualized paranasal sinuses are essentially clear. The mastoid air cells are unopacified. No evidence of calvarial fracture. IMPRESSION: No evidence of acute intracranial  abnormality. Atrophy with small vessel ischemic changes. Old lacunar infarcts, as above. Electronically Signed   By: Charline BillsSriyesh  Krishnan M.D.   On: 11/27/2015 14:22   Dg Chest Port 1 View  11/27/2015  CLINICAL DATA:  Unresponsive EXAM: PORTABLE CHEST 1 VIEW COMPARISON:  11/06/2015 FINDINGS: Normal heart size. Lungs clear. No pneumothorax. No pleural effusion. Chronic right-sided rib deformities. IMPRESSION: No active disease. Electronically Signed   By: Jolaine ClickArthur  Hoss M.D.   On: 11/27/2015 13:27     Medications:   . dextrose 75 mL/hr at 11/28/15 0953  . norepinephrine 6 mcg/min (11/28/15 1053)   . heparin  5,000 Units Subcutaneous Q8H  . insulin aspart  0-24 Units Subcutaneous Q4H  . piperacillin-tazobactam (ZOSYN)  IV  3.375 g Intravenous Q12H  .  potassium chloride  10 mEq Intravenous Q1 Hr x 2  . potassium phosphate IVPB (mmol)  10 mmol Intravenous Once  . sodium chloride flush  3 mL Intravenous Q12H   acetaminophen **OR** acetaminophen, ipratropium-albuterol, morphine injection, ondansetron **OR** ondansetron (ZOFRAN) IV  Assessment/ Plan:  Ms. JANNELLY BERGREN is a 82 y.o. white female with Dementia, hypertension, COPD, recent frequent falls him a recent rib fractures, who was admitted to [facility] on 11/27/2015  1. Acute renal failure due to ATN with metabolic acidosis: from prerenal azotemia. Baseline creatinine of 0.6. Creatinine improving.  - Discontinue sodium acetate due to hypernatremia and improved acidosis.  - Decrease IV fluids to D5W No a candidate for long term dialysis. Palliative care consult pending.   2. Hypernatremia: from free water deficit. 1.4 litre deficit - continue IV D5W at 75  3. Hyperkalemia: now with potassium 3 and getting IV potassium replacement.    LOS: 1 Mannie Wineland 7/10/201710:54 AM

## 2015-11-28 NOTE — Progress Notes (Signed)
MEDICATION RELATED CONSULT NOTE - INITIAL   Pharmacy Consult for electrolyte replacement Indication: hypokalemia  No Known Allergies  Patient Measurements: Height: 5\' 5"  (165.1 cm) Weight: 120 lb 13 oz (54.8 kg) IBW/kg (Calculated) : 57 Adjusted Body Weight:   Vital Signs: Temp: 97.5 F (36.4 C) (07/10 0600) Temp Source: Other (Comment) (07/10 0400) BP: 93/46 mmHg (07/10 0600) Pulse Rate: 82 (07/10 0600) Intake/Output from previous day: 07/09 0701 - 07/10 0700 In: 7805.2 [I.V.:7755.2; IV Piggyback:50] Out: 1825 [Urine:1825] Intake/Output from this shift:    Labs:  Recent Labs  11/27/15 1248  11/27/15 1330 11/27/15 1554 11/27/15 1734 11/27/15 2259 11/28/15 0519  WBC 20.0*  --   --   --  22.2*  --  14.0*  HGB 17.0*  --   --   --  13.6  --  12.3  HCT 51.8*  --   --   --  43.1  --  36.8  PLT 368  --   --   --  221  --  195  APTT  --   --   --  <24*  --   --   --   CREATININE  --   < > 5.80*  --  4.89* 4.04* 3.23*  MG  --   --   --   --   --   --  2.3  PHOS  --   --   --   --   --   --  2.1*  ALBUMIN  --   --  2.5*  --   --   --   --   PROT  --   --  5.7*  --   --   --   --   AST  --   --  63*  --   --   --   --   ALT  --   --  56*  --   --   --   --   ALKPHOS  --   --  125  --   --   --   --   BILITOT  --   --  0.9  --   --   --   --   < > = values in this interval not displayed. Estimated Creatinine Clearance: 11.4 mL/min (by C-G formula based on Cr of 3.23).   Microbiology: Recent Results (from the past 720 hour(s))  MRSA PCR Screening     Status: None   Collection Time: 11/27/15  5:13 PM  Result Value Ref Range Status   MRSA by PCR NEGATIVE NEGATIVE Final    Comment:        The GeneXpert MRSA Assay (FDA approved for NASAL specimens only), is one component of a comprehensive MRSA colonization surveillance program. It is not intended to diagnose MRSA infection nor to guide or monitor treatment for MRSA infections.     Medical History: Past  Medical History  Diagnosis Date  . Dementia   . Osteoporosis   . Hypertension   . COPD (chronic obstructive pulmonary disease) (HCC)     Medications:  Infusions:  . dextrose    . norepinephrine 7 mcg/min (11/28/15 0528)  . sodium acetate 100 mEq in dextrose 5% 1,000 mL infusion      Assessment: K 3, Mg 2.3, Phos 2.1, Ca 6.4, adjusted calcium 7.6.  Goal of Therapy:  WNL  Plan:  Potassium chloride 10 mEq IV Q1H x 4 doses, calcium gluconate 1 gm IV x  1, potassium phosphate 10 mmol IV x 1. Will recheck BMP at 1500 and all electrolytes with AM labs.  Carola Frost, Pharm.D., BCPS Clinical Pharmacist 11/28/2015,7:22 AM

## 2015-11-28 NOTE — Care Management (Signed)
From [facility]. CSW updated. On CRRT. Palliative consulting. Family meeting tomorrow.

## 2015-11-28 NOTE — Progress Notes (Addendum)
Thank you for consulting the Palliative Medicine Team at [facility] to meet your patient's and family's needs.   The reason that you asked us to see your patient is  For Establishing GOC   We have scheduled your patient for a meeting: Tomorrow morning at 1030 with daughter Sallyanne HaversSheila Jefferies  Lorinda CreedMary Larach NP  Palliative Medicine Team Team Phone # [phone] Pager [phone]

## 2015-11-28 NOTE — Progress Notes (Signed)
MEDICATION RELATED CONSULT NOTE - INITIAL   Pharmacy Consult for Electrolyte replacement Indication: electrolytes  No Known Allergies  Patient Measurements: Height: 5\' 5"  (165.1 cm) Weight: 120 lb 13 oz (54.8 kg) IBW/kg (Calculated) : 57 Adjusted Body Weight:   Vital Signs: Temp: 100.2 F (37.9 C) (07/10 2000) BP: 108/47 mmHg (07/10 2000) Pulse Rate: 93 (07/10 2000) Intake/Output from previous day: 07/09 0701 - 07/10 0700 In: 7805.2 [I.V.:7755.2; IV Piggyback:50] Out: 1825 [Urine:1825] Intake/Output from this shift:    Labs:  Recent Labs  11/27/15 1248  11/27/15 1330 11/27/15 1554 11/27/15 1734 11/27/15 2259 11/28/15 0519 11/28/15 0529 11/28/15 1845  WBC 20.0*  --   --   --  22.2*  --  14.0*  --   --   HGB 17.0*  --   --   --  13.6  --  12.3  --   --   HCT 51.8*  --   --   --  43.1  --  36.8  --   --   PLT 368  --   --   --  221  --  195  --   --   APTT  --   --   --  <24*  --   --   --   --   --   CREATININE  --   < > 5.80*  --  4.89* 4.04* 3.23*  --  2.35*  MG  --   --   --   --   --   --  2.3  --   --   PHOS  --   --   --   --   --   --  2.1*  --   --   ALBUMIN  --   --  2.5*  --   --   --   --  2.0*  --   PROT  --   --  5.7*  --   --   --   --   --   --   AST  --   --  63*  --   --   --   --   --   --   ALT  --   --  56*  --   --   --   --   --   --   ALKPHOS  --   --  125  --   --   --   --   --   --   BILITOT  --   --  0.9  --   --   --   --   --   --   < > = values in this interval not displayed. Estimated Creatinine Clearance: 15.7 mL/min (by C-G formula based on Cr of 2.35).   Microbiology: Recent Results (from the past 720 hour(s))  Culture, blood (Routine x 2)     Status: None (Preliminary result)   Collection Time: 11/27/15 12:48 PM  Result Value Ref Range Status   Specimen Description BLOOD RIGHT ASSIST CONTROL  Final   Special Requests   Final    BOTTLES DRAWN AEROBIC AND ANAEROBIC 10CC AEROBIC, 5 CC ANAEROBIC   Culture NO GROWTH < 24 HOURS   Final   Report Status PENDING  Incomplete  Culture, blood (Routine x 2)     Status: None (Preliminary result)   Collection Time: 11/27/15 12:48 PM  Result Value Ref Range Status   Specimen Description BLOOD  LEFT ASSIST CONTROL  Final   Special Requests BOTTLES DRAWN AEROBIC AND ANAEROBIC 5 CC  Final   Culture NO GROWTH < 24 HOURS  Final   Report Status PENDING  Incomplete  MRSA PCR Screening     Status: None   Collection Time: 11/27/15  5:13 PM  Result Value Ref Range Status   MRSA by PCR NEGATIVE NEGATIVE Final    Comment:        The GeneXpert MRSA Assay (FDA approved for NASAL specimens only), is one component of a comprehensive MRSA colonization surveillance program. It is not intended to diagnose MRSA infection nor to guide or monitor treatment for MRSA infections.     Medical History: Past Medical History  Diagnosis Date  . Dementia   . Osteoporosis   . Hypertension   . COPD (chronic obstructive pulmonary disease) (HCC)     Medications:  Scheduled:  . antiseptic oral rinse  7 mL Mouth Rinse q12n4p  . calcium gluconate  1 g Intravenous Once  . chlorhexidine  15 mL Mouth Rinse BID  . heparin  5,000 Units Subcutaneous Q8H  . insulin aspart  0-24 Units Subcutaneous Q4H  . piperacillin-tazobactam (ZOSYN)  IV  3.375 g Intravenous Q12H  . potassium chloride  10 mEq Intravenous Q1 Hr x 4  . sodium chloride flush  3 mL Intravenous Q12H    Assessment: 7/11 @ 18:30 :  K = 3.0,  Ca = 6.5,  Corrected Ca = 8.1   Goal of Therapy:  Normalization of electrolytes  Plan:  Will order KCl 10 mEq IV X 4 , Calcium gluconate 1 gm IV X 1 . Will recheck electrolytes on 7/11 with AM labs.   Azizi Bally D 11/28/2015,9:37 PM

## 2015-11-28 NOTE — Progress Notes (Addendum)
PULMONARY / CRITICAL CARE MEDICINE   Name: Katherine Bartlett MRN: [id number] DOB: 1931-02-11    ADMISSION DATE:  11/27/2015  CHIEF COMPLAINT:  AMS, septic shock, hypernatremia and impending respiratory failure  HISTORY OF PRESENT ILLNESS:   82 year old female past medical history of dementia, COPD, recent hypertension, osteoporosis, recent admission to the hospital after several mechanical falls with lumbar compression fracture and repeat fractures in June 2017, readmitted back to the hospital for unresponsiveness, septic shock, lactic acidosis, impending respiratory failure, therefore PCCM was consulted.  Initially called by ER physician about this patient, stating that patient was mildly responsive, but in no impending respiratory failure, she received 2.5 L of fluid resuscitation, and was on her third liter when her blood pressures started to improve and systolics in the 70s, she had a lactic acid initially of 8.3, repeat lactic acid was 5.8, at that point given the improvement in the patient's blood pressure response to fluids, ER physician stated that hospitalists would admit patient, and if there are any test, do a consult CCM. Shortly after evaluation by hospitalist, patient was noted to be in septic shock, requiring low dose pressors, only responsive to sternal rub, and CCM was consulted. Upon my evaluation, the patient with open mouth breathing, not responding to verbal stimuli, grimacing to sternal rub only. Spoke with patient's healthcare power of attorney who is her daughter over the phone and in person, who stated that Patient with declining health status over the past couple of months, multiple falls, dementia, and at this time she wants medical treatment with DO NOT RESUSCITATE status. Further history is as follows, she was recently admitted to [facility], discharged in mid June, transferred to [facility] facility over the past 2 weeks, has had worsening dementia, but still  oriented to self, noted to have shortness of breath and wheezing on Friday, and today nursing staff found the patient mild responsive and hypoxic, she was placed on nasal cannula and EMS was called, difficult to obtain blood pressure per EMS records. On arrival to the ED she was given 2.5 L of normal saline as part of IV fluid resuscitation, she was noted to have a significant hypernatremia and significant worsening renal function.  SUBJECTIVE:  No acute issues overnight. Remains acutely iill, remains obtunded; mourns and groans with position changes On vasopressors  VITAL SIGNS: BP 93/46 mmHg  Pulse 82  Temp(Src) 97.5 F (36.4 C) (Core (Comment))  Resp 23  Ht 5\' 5"  (1.651 m)  Wt 120 lb 13 oz (54.8 kg)  BMI 20.10 kg/m2  SpO2 100%   INTAKE / OUTPUT: I/O last 3 completed shifts: In: 7805.2 [I.V.:7755.2; IV Piggyback:50] Out: 1825 [Urine:1825]  PHYSICAL EXAMINATION: General: Acutely ill looking HEENT: PERRLA, oral mucosa dry Neuro: Withdraws to pain, not following commands Cardiovascular: NSR, s1/s2, no MRG Lungs: Labored, bilateral airflow, no wheezes Abdomen:  Non distended, hypoactive bowel sounds, diffuse tenderness on palpation GU: Foley cath - thick whitish fluid with layering the foley bag   Musculoskeletal:+rom Extremities: +2 pulses Skin:  Poor skin turgor  LABS:  BMET  Recent Labs Lab 11/27/15 1734 11/27/15 2259 11/28/15 0519  NA 161* 157* 157*  K 5.1 3.8 3.0*  CL >130* 128* 122*  CO2 15* 21* 28  BUN 143* 146* 115*  CREATININE 4.89* 4.04* 3.23*  GLUCOSE 196* 273* 154*    Electrolytes  Recent Labs Lab 11/27/15 1734 11/27/15 2259 11/28/15 0519  CALCIUM 7.3* 6.6* 6.4*  MG  --   --  2.3  PHOS  --   --  2.1*    CBC  Recent Labs Lab 11/27/15 1248 11/27/15 1734 11/28/15 0519  WBC 20.0* 22.2* 14.0*  HGB 17.0* 13.6 12.3  HCT 51.8* 43.1 36.8  PLT 368 221 195    Coag's  Recent Labs Lab 11/27/15 1554  APTT <24*  INR 1.73    Sepsis  Markers  Recent Labs Lab 11/27/15 1248 11/27/15 1554 11/28/15 0529  LATICACIDVEN 8.2* 5.8* 2.3*    ABG  Recent Labs Lab 11/27/15 1704 11/28/15 0437  PHART 7.29* 7.55*  PCO2ART 29* 34  PO2ART 127* 115*    Liver Enzymes  Recent Labs Lab 11/27/15 1330  AST 63*  ALT 56*  ALKPHOS 125  BILITOT 0.9  ALBUMIN 2.5*    Cardiac Enzymes  Recent Labs Lab 11/27/15 1734 11/27/15 2259 11/28/15 0519  TROPONINI 0.06* 0.08* 0.14*    Glucose  Recent Labs Lab 11/27/15 1306 11/27/15 1708 11/27/15 2026 11/27/15 2349 11/28/15 0330  GLUCAP 204* 203* 258* 227* 153*    Imaging Dg Pelvis 1-2 Views  11/27/2015  CLINICAL DATA:  Pt presents to ED via EMS from [facility] where SNF staff report pt was found 40 min ago unresponsive with increased respirations and clammy. Staff report yesterday pt was AANDO to self only. Hx frequent UTIs, se.*comment was truncated* EXAM: PELVIS - 1-2 VIEW COMPARISON:  Plain film 09/06/2015 FINDINGS: Remote RIGHT inferior pubic ramus fracture. No evidence acute fracture or dislocation. Bladder calculus noted. IMPRESSION: No acute fracture or dislocation. Remote RIGHT inferior pubic ramus fracture. Electronically Signed   By: Genevive Bi M.D.   On: 11/27/2015 13:30   Dg Abd 1 View  11/27/2015  CLINICAL DATA:  Found unresponsive. EXAM: ABDOMEN - 1 VIEW COMPARISON:  CT abdomen FINDINGS: No dilated loops large or small bowel. Moderate volume stool in the rectum. Large bladder calculus noted. No aggressive osseous lesion. Degenerate changes spine. IMPRESSION: No evidence of bowel obstruction. Electronically Signed   By: Genevive Bi M.D.   On: 11/27/2015 13:29   Ct Head Wo Contrast  11/27/2015  CLINICAL DATA:  Unresponsive EXAM: CT HEAD WITHOUT CONTRAST TECHNIQUE: Contiguous axial images were obtained from the base of the skull through the vertex without intravenous contrast. COMPARISON:  11/06/2015 FINDINGS: No evidence of parenchymal  hemorrhage or extra-axial fluid collection. No mass lesion, mass effect, or midline shift. No CT evidence of acute infarction. Subcortical white matter and periventricular small vessel ischemic changes. Intracranial atherosclerosis. Old left thalamic and right basal ganglia lacunar infarcts. Global cortical and central atrophy. Secondary ventricular prominence. The visualized paranasal sinuses are essentially clear. The mastoid air cells are unopacified. No evidence of calvarial fracture. IMPRESSION: No evidence of acute intracranial abnormality. Atrophy with small vessel ischemic changes. Old lacunar infarcts, as above. Electronically Signed   By: Charline Bills M.D.   On: 11/27/2015 14:22   Dg Chest Port 1 View  11/27/2015  CLINICAL DATA:  Unresponsive EXAM: PORTABLE CHEST 1 VIEW COMPARISON:  11/06/2015 FINDINGS: Normal heart size. Lungs clear. No pneumothorax. No pleural effusion. Chronic right-sided rib deformities. IMPRESSION: No active disease. Electronically Signed   By: Jolaine Click M.D.   On: 11/27/2015 13:27    STUDIES:  None  CULTURES: BCx2 7/9> UC 7/9> Sputum  ANTIBIOTICS: Zosyn 7/9 Vanc 7/9>  SIGNIFICANT EVENTS:  7/7>>Recently transferred to [facility] nursing facility, decreased appetite over the past week, increased shortness of breath, worsening dementia over the last couple of days 07/09: Found unresponsive today with severe  hyponatremia, dehydration and MOF; made DNR/DNI  LINES/TUBES: PIVs  DISCUSSION: 82 year old with past medical history of dementia, COPD, hypertension, osteoporosis, now with severe sepsis, metabolic acidosis, hypernatremia, metabolic encephalopathy, acute renal failure, impending respiratory failure, septic shock, possibly due to urinary tract infection and dehydration.  ASSESSMENT / PLAN:  PULMONARY A: Acute respiratory failure secondary to severe metabolic acidosis, sepsis and septic shock Severe metabolic acidosis-Improved with IV fluids and  bicarb infusion P:   -Decrease bicarb infusion to 127ml/hr -PRN morphine for increased WOB -Trend lactic acid level -ABG prn  CARDIOVASCULAR A:  Severe septic shock Multiorgan failure Elevated troponin Afib-Now in NSR P:  -Continue IV fluids-D5W at 100cc/hr -Low dose levophed and titrate to MAP >65 -Hemodynamics per ICU protocol  RENAL A:   Acute renal failure-Creatinine improving Hypernatremia Hypokalemia Hypocalcemia Hypophosphatemia P:   -Monitor and correct electrolyte   -Trend creatinine -No CRRT per family -Renally dose medications   GASTROINTESTINAL A:   Constipation-improved; 2 bowel movements since admission P:   -IV fluids -prn laxatives  INFECTIOUS A:   UROSEPSIS P:   -F/U Cultures -Empiric antibiotics  NEUROLOGIC A:   Acute metabolic encephalopathy secondary to sepsis and uremia H/o Dementia P:   RASS goal: n/a -Monitor mental status -Suportive care   Disposition and family update: No family at bedside. Will update once available  Best Practice: Code Status:  DNR Diet: Heart Healthy / Carb Mod. GI prophylaxis:  PPI. VTE prophylaxis:  SCD's / heparin.  [facility] ANP-BC Pulmonary and Critical Care Medicine [facility] Pager [phone] or [phone]   11/28/2015, 7:03 AM  STAFF NOTE: I, Dr. Lucie Leather,  have personally reviewed patient's available data, including medical history, events of note, physical examination and test results as part of my evaluation. I have discussed with NP and other care providers such as pharmacist, RN and RRT.    The Patient requires high complexity decision making for assessment and support, frequent evaluation and titration of therapies, application of advanced monitoring technologies and extensive interpretation of multiple databases. Critical Care Time devoted to patient care services described in this note is 35 minutes.  This Critical care time does not reflrect procedure  time or supervisory time of NP but could involve care discussion time Overall, patient is critically ill, prognosis is guarded.  Patient with Multiorgan failure and at high risk for cardiac arrest and death.    Lucie Leather, M.D.  [facility]  Medical Director [facility] Medical Director [facility] Cardio-Pulmonary Department

## 2015-11-29 DIAGNOSIS — N179 Acute kidney failure, unspecified: Secondary | ICD-10-CM | POA: Insufficient documentation

## 2015-11-29 DIAGNOSIS — I959 Hypotension, unspecified: Secondary | ICD-10-CM

## 2015-11-29 DIAGNOSIS — Z66 Do not resuscitate: Secondary | ICD-10-CM

## 2015-11-29 DIAGNOSIS — R52 Pain, unspecified: Secondary | ICD-10-CM | POA: Insufficient documentation

## 2015-11-29 LAB — GLUCOSE, CAPILLARY
GLUCOSE-CAPILLARY: 123 mg/dL — AB (ref 65–99)
GLUCOSE-CAPILLARY: 89 mg/dL (ref 65–99)
Glucose-Capillary: 105 mg/dL — ABNORMAL HIGH (ref 65–99)
Glucose-Capillary: 121 mg/dL — ABNORMAL HIGH (ref 65–99)
Glucose-Capillary: 131 mg/dL — ABNORMAL HIGH (ref 65–99)
Glucose-Capillary: 145 mg/dL — ABNORMAL HIGH (ref 65–99)

## 2015-11-29 LAB — BASIC METABOLIC PANEL
ANION GAP: 8 (ref 5–15)
BUN: 80 mg/dL — AB (ref 6–20)
CALCIUM: 7 mg/dL — AB (ref 8.9–10.3)
CO2: 29 mmol/L (ref 22–32)
Chloride: 114 mmol/L — ABNORMAL HIGH (ref 101–111)
Creatinine, Ser: 2.01 mg/dL — ABNORMAL HIGH (ref 0.44–1.00)
GFR calc Af Amer: 25 mL/min — ABNORMAL LOW (ref >60)
GFR, EST NON AFRICAN AMERICAN: 22 mL/min — AB (ref >60)
GLUCOSE: 161 mg/dL — AB (ref 65–99)
Potassium: 4 mmol/L (ref 3.5–5.1)
Sodium: 151 mmol/L — ABNORMAL HIGH (ref 135–145)

## 2015-11-29 MED ORDER — LORAZEPAM 2 MG/ML IJ SOLN: 1.0000 mg | Freq: Four times a day (QID) | INTRAMUSCULAR | Status: DC | PRN

## 2015-11-29 MED ORDER — MORPHINE SULFATE (PF) 2 MG/ML IV SOLN
2.0000 mg | INTRAVENOUS | Status: DC | PRN
  Administered 2015-11-29 (×3): 2 mg via INTRAVENOUS
  Filled 2015-11-29 (×3): qty 1

## 2015-11-29 MED ORDER — NOREPINEPHRINE 4 MG/250ML-% IV SOLN: 0.0000 ug/min | INTRAVENOUS | Status: DC

## 2015-11-29 NOTE — Progress Notes (Signed)
Swelling noted to left arm. Pt had BP cuff plus peripheral IV with Levophed running in that line. IV flushed and blood return noted. Dr. Belia HemanKasa notified and he stated to continue to use that IV and wait for family to meet with palliative care today to determine next steps of care. Yancy Hascall E 8:26 AM 11/29/2015

## 2015-11-29 NOTE — Consult Note (Signed)
Pharmacy Antibiotic Note  Katherine Bartlett is a 82 y.o. female admitted on 11/27/2015 with UTI.  Pharmacy has been consulted for Zosyn dosing. Patient with recent history of ecoli resistant to ceftriaxone.    Plan: Will continue Zosyn EI 3.375g IV q12hr. Will narrow based on final culture results.    Height: 5\' 5"  (165.1 cm) Weight: 115 lb 8.3 oz (52.4 kg) IBW/kg (Calculated) : 57  Temp (24hrs), Avg:99.1 F (37.3 C), Min:98.4 F (36.9 C), Max:100.4 F (38 C)   Recent Labs Lab 11/27/15 1248  11/27/15 1554 11/27/15 1734 11/27/15 2259 11/28/15 0519 11/28/15 0529 11/28/15 1845 11/29/15 0428  WBC 20.0*  --   --  22.2*  --  14.0*  --   --   --   CREATININE  --   < >  --  4.89* 4.04* 3.23*  --  2.35* 2.01*  LATICACIDVEN 8.2*  --  5.8*  --   --   --  2.3*  --   --   < > = values in this interval not displayed.  Estimated Creatinine Clearance: 17.5 mL/min (by C-G formula based on Cr of 2.01).    No Known Allergies  Antimicrobials this admission: vancomycin 7/9 >> 7/10 zosyn 7/9 >>   Dose adjustments this admission:   Microbiology results: 7/9 BCx: no growth < 24 hours  7/9 UCx: > 100K E.coli   7/9 MRSA PCR: negatvie   Pharmacy will continue to monitor and adjust per consult.  MLS  11/29/2015 1:29 PM

## 2015-11-29 NOTE — Consult Note (Signed)
Consultation Note Date: 11/29/2015   Patient Name: Katherine Bartlett  DOB: [birth year]  MRN: [id number]  Age / Sex: 82 y.o., female  PCP: Jaclyn Shaggy, MD Referring Physician: Erin Fulling, MD  Reason for Consultation: Establishing goals of care and Psychosocial/spiritual support  HPI/Patient Profile: 82 y.o. female   admitted on 11/27/2015 with known history of dementia, COPD, essential hypertension, osteoporosis, who was admitted to the hospital for fall, lumbar compression fracture,  presents to the hospital with complaints of shortness of breath.  Patient is resident of [facility]  Readmitted back to the hospital for unresponsiveness, septic shock, lactic acidosis, impending respiratory failure.   Continued physical, functional and cognitive decline within context of hospitalization.   Family is faced with advanced directive decisions and anticipatory care needs.     Clinical Assessment and Goals of Care:  Consult is for review of medical treatment options, clarification of goals of care and end of life issues, disposition and options, and symptom recommendation.  This NP Lorinda Creed reviewed medical records, received report from team, assessed the patient and then meet at the patient's bedside along with her daughter/[name] to discuss diagnosis, prognosis, GOC, EOL wishes disposition and options.   A detailed discussion was had today regarding advanced directives.  Concepts specific to code status, artifical feeding and hydration, continued IV antibiotics and rehospitalization was had.  The difference between a aggressive medical intervention path  and a palliative comfort care path for this patient at this time was had.  Values and goals of care important to patient and family were attempted to be elicited.  Concept of Hospice and Palliative Care were discussed  Natural trajectory and  expectations at EOL were discussed.  Questions and concerns addressed.   Family encouraged to call with questions or concerns.  PMT will continue to support holistically.   Daughter/ [name] verbalizes understanding of her mother's limited prognosis but is having a difficult time "letting go, she is all I have".  She does not want her to suffer, today she agrees to no escalation of care.  If weaned off pressors is open to transition to a regular floor.  She wants her to have medications for pain "if she needs it".   We talked to the fact that anything can happen at any time and she "is ready when the time comes, she has had a good life."    At this time she wishes to continue IV fluids, antibiotics.  "I just need to talk to  My brother in [place] and and get my self prepared"   I will f/u in the morning again at 1030 with family to further calarify GOC     SUMMARY OF RECOMMENDATIONS    Code Status/Advance Care Planning:  DNR    Symptom Management:     Pain/Dyspnea: Morphine 2 mg IV every 1 hr prn  Agitaion: Ativan 1 mg IV every 6 hr sprn  Palliative Prophylaxis:   Aspiration, Bowel Regimen, Delirium Protocol, Frequent Pain Assessment and Oral Care  Additional  Recommendations (Limitations, Scope, Preferences):  No Artificial Feeding, No Blood Transfusions, No Chemotherapy, No Hemodialysis and No Surgical Procedures  Psycho-social/Spiritual:   Desire for further Chaplaincy support:yes  Additional Recommendations: Education on Hospice  Prognosis:   < 2 weeks  Discharge Planning: To Be Determined      Primary Diagnoses: Present on Admission:  . Sepsis (HCC)  I have reviewed the medical record, interviewed the patient and family, and examined the patient. The following aspects are pertinent.  Past Medical History  Diagnosis Date  . Dementia   . Osteoporosis   . Hypertension   . COPD (chronic obstructive pulmonary disease) (HCC)    Social History   Social History  .  Marital Status: Widowed    Spouse Name: N/A  . Number of Children: N/A  . Years of Education: N/A   Social History Main Topics  . Smoking status: Never Smoker   . Smokeless tobacco: None  . Alcohol Use: No  . Drug Use: No  . Sexual Activity: Not Asked   Other Topics Concern  . None   Social History Narrative   History reviewed. No pertinent family history. Scheduled Meds: . antiseptic oral rinse  7 mL Mouth Rinse q12n4p  . chlorhexidine  15 mL Mouth Rinse BID  . heparin  5,000 Units Subcutaneous Q8H  . insulin aspart  0-24 Units Subcutaneous Q4H  . piperacillin-tazobactam (ZOSYN)  IV  3.375 g Intravenous Q12H  . sodium chloride flush  3 mL Intravenous Q12H   Continuous Infusions: . dextrose 75 mL/hr at 11/28/15 2231  . norepinephrine 4 mcg/min (11/29/15 0331)   PRN Meds:.acetaminophen **OR** acetaminophen, ipratropium-albuterol, morphine injection, ondansetron **OR** ondansetron (ZOFRAN) IV Medications Prior to Admission:  Prior to Admission medications   Medication Sig Start Date End Date Taking? Authorizing Provider  acetaminophen (TYLENOL) 500 MG tablet Take 500 mg by mouth every 4 (four) hours as needed for mild pain or moderate pain. *Do not exceed 3 G in 24 hours*   Yes Historical Provider, MD  albuterol (PROVENTIL HFA;VENTOLIN HFA) 108 (90 Base) MCG/ACT inhaler Inhale 2 puffs into the lungs every 6 (six) hours as needed for wheezing or shortness of breath.   Yes Historical Provider, MD  alendronate (FOSAMAX) 70 MG tablet Take 70 mg by mouth once a week. Take with a full glass of water on an empty stomach.   Yes Historical Provider, MD  aspirin 81 MG tablet Take 81 mg by mouth daily.   Yes Historical Provider, MD  atenolol (TENORMIN) 25 MG tablet Take 25 mg by mouth daily.   Yes Historical Provider, MD  bisacodyl (DULCOLAX) 5 MG EC tablet Take 1 tablet (5 mg total) by mouth daily as needed for moderate constipation. 11/09/15  Yes Wyatt Hasteavid K Hower, MD  Calcium  Carbonate-Vitamin D (CALCIUM 600+D) 600-400 MG-UNIT tablet Take 1 tablet by mouth 2 (two) times daily.   Yes Historical Provider, MD  docusate sodium (COLACE) 100 MG capsule Take 1 capsule (100 mg total) by mouth 2 (two) times daily. 11/09/15  Yes Wyatt Hasteavid K Hower, MD  ipratropium-albuterol (DUONEB) 0.5-2.5 (3) MG/3ML SOLN Take 3 mLs by nebulization every 6 (six) hours as needed. For shortness of breath/wheezing.   Yes Historical Provider, MD  tiotropium (SPIRIVA) 18 MCG inhalation capsule Place 18 mcg into inhaler and inhale daily.   Yes Historical Provider, MD  Vitamin D, Ergocalciferol, (DRISDOL) 50000 units CAPS capsule Take 1 capsule (50,000 Units total) by mouth every 7 (seven) days. 11/09/15  Yes Wyatt Hasteavid K Hower,  MD  HYDROcodone-acetaminophen (NORCO/VICODIN) 5-325 MG tablet Take 1-2 tablets by mouth every 4 (four) hours as needed for moderate pain. 11/09/15   Wyatt Haste, MD   No Known Allergies Review of Systems  Unable to perform ROS: Acuity of condition     Physical Exam  Constitutional: She appears cachectic. She appears ill.  -minimally responsive, moans and grimaces with any reposition   HENT:  Mouth/Throat: Mucous membranes are dry.  Cardiovascular: Normal rate, regular rhythm and normal heart sounds.   Pulmonary/Chest: She has decreased breath sounds in the right lower field and the left lower field.  Skin: Skin is warm and dry.    Vital Signs: BP 116/50 mmHg  Pulse 83  Temp(Src) 99 F (37.2 C) (Core (Comment))  Resp 17  Ht  (1.651 m)  Wt 52.4 kg (115 lb 8.3 oz)  BMI 19.22 kg/m2  SpO2 100% Pain Assessment: PAINAD       SpO2: SpO2: 100 % O2 Device:SpO2: 100 % O2 Flow Rate: .O2 Flow Rate (L/min): 2 L/min  IO: Intake/output summary:  Intake/Output Summary (Last 24 hours) at 11/29/15 1027 Last data filed at 11/29/15 0800  Gross per 24 hour  Intake   2743 ml  Output   3201 ml  Net   -458 ml    LBM: Last BM Date: 11/29/15 Baseline Weight: Weight: 51.256 kg  (113 lb) Most recent weight: Weight: 52.4 kg (115 lb 8.3 oz)      Palliative Assessment/Data: 20 %   Discussed with Dr Belia Heman  Time In: 1015 Time Out: 1130 Time Total: 75 min Greater than 50%  of this time was spent counseling and coordinating care related to the above assessment and plan.  Signed by: Lorinda Creed, NP   Please contact Palliative Medicine Team phone at [phone] for questions and concerns.  For individual provider: See Loretha Stapler

## 2015-11-29 NOTE — Progress Notes (Signed)
This RN spoke with Dr. Belia HemanKasa about moving the patient to a regular room. Levophed weaned off per Lorinda CreedMary Larach NP. Will transfer patient to a regular room with orders for no escalation. Shakir Petrosino E 1:09 PM 11/29/2015

## 2015-11-29 NOTE — Clinical Social Work Note (Signed)
Patient has been a resident of [facility]. Patient has been admitted to ICU and Palliative Care has spoken with patient's daughter who is wanting to speak with her brother in [place] before making in final decisions. It appears as though patient might be transitioned to comfort care if patient's daughter and son are able to discuss goals of care further. Palliative Care to follow up with patient's daughter. CSW will defer initial assessment until more has been determined regarding patient's daughter and son's decision. CSW will allow time for the daughter to contact her brother and to process the information she discussed with Palliative Care today. York SpanielMonica Nahshon Reich MSW,LCSW [phone]

## 2015-11-29 NOTE — NC FL2 (Signed)
[place] MEDICAID FL2 LEVEL OF CARE SCREENING TOOL     IDENTIFICATION  Patient Name: Katherine Bartlett Birthdate: 01-23-1931 Sex: female Admission Date (Current Location): 11/27/2015  [place] and [place] Number:  Chiropodist and Address:  [facility], [address], [place], [place] [address]      Provider Number: [id number]  Attending Physician Name and Address:  Erin Fulling, MD  Relative Name and Phone Number:       Current Level of Care: Hospital Recommended Level of Care: Skilled Nursing Facility Prior Approval Number:    Date Approved/Denied:   PASRR Number: [id number]  Discharge Plan: SNF    Current Diagnoses: Patient Active Problem List   Diagnosis Date Noted  . DNR (do not resuscitate) 11/29/2015  . Acute renal failure (HCC)   . Arterial hypotension   . Pain, generalized   . Palliative care encounter   . Sepsis (HCC) 11/27/2015  . Acute pyelonephritis 11/27/2015  . Metabolic acidosis 11/27/2015  . Lactic acidosis 11/27/2015  . Hypernatremia 11/27/2015  . Hyperkalemia 11/27/2015  . Hyperglycemia 11/27/2015  . Elevated troponin 11/27/2015  . Leukocytosis 11/27/2015  . Dehydration 11/27/2015  . Coagulopathy (HCC) 11/27/2015  . Rib fracture 11/06/2015  . UTI (lower urinary tract infection) 07/14/2015  . Fracture of cervical vertebra (HCC) 09/11/2012  . Chronic obstructive pulmonary disease (HCC) 09/11/2012  . BP (high blood pressure) 09/11/2012    Orientation RESPIRATION BLADDER Height & Weight      (none)  Normal, O2 (2 liters) Continent Weight: 115 lb 8.3 oz (52.4 kg) Height:   (165.1 cm)  BEHAVIORAL SYMPTOMS/MOOD NEUROLOGICAL BOWEL NUTRITION STATUS   (none)   Incontinent Diet (npo currently)  AMBULATORY STATUS COMMUNICATION OF NEEDS Skin   Total Care Non-Verbally  (laceration)                       Personal Care Assistance Level of Assistance  Total care Bathing Assistance: Maximum  assistance Feeding assistance: Maximum assistance Dressing Assistance: Maximum assistance Total Care Assistance: Maximum assistance   Functional Limitations Info    Sight Info: Adequate Hearing Info: Impaired Speech Info: Impaired    SPECIAL CARE FACTORS FREQUENCY                       Contractures Contractures Info: Not present    Additional Factors Info  Code Status Code Status Info: dnr             Current Medications (11/29/2015):  This is the current hospital active medication list Current Facility-Administered Medications  Medication Dose Route Frequency Provider Last Rate Last Dose  . acetaminophen (TYLENOL) tablet 650 mg  650 mg Oral Q6H PRN Katharina Caper, MD       Or  . acetaminophen (TYLENOL) suppository 650 mg  650 mg Rectal Q6H PRN Katharina Caper, MD      . antiseptic oral rinse (CPC / CETYLPYRIDINIUM CHLORIDE 0.05%) solution 7 mL  7 mL Mouth Rinse q12n4p Vishal Mungal, MD   7 mL at 11/29/15 1235  . chlorhexidine (PERIDEX) 0.12 % solution 15 mL  15 mL Mouth Rinse BID Vishal Mungal, MD   15 mL at 11/29/15 1112  . dextrose 5 % solution   Intravenous Continuous Lamont Dowdy, MD 75 mL/hr at 11/29/15 1112    . heparin injection 5,000 Units  5,000 Units Subcutaneous Q8H Katharina Caper, MD   5,000 Units at 11/29/15 0615  . insulin aspart (novoLOG) injection  0-24 Units  0-24 Units Subcutaneous Q4H Vishal Mungal, MD   2 Units at 11/29/15 1235  . ipratropium-albuterol (DUONEB) 0.5-2.5 (3) MG/3ML nebulizer solution 3 mL  3 mL Nebulization Q6H PRN Katharina Caperima Vaickute, MD   3 mL at 11/28/15 1954  . LORazepam (ATIVAN) injection 1 mg  1 mg Intravenous Q6H PRN Canary BrimMary W Larach, NP      . morphine 2 MG/ML injection 2 mg  2 mg Intravenous Q1H PRN Canary BrimMary W Larach, NP   2 mg at 11/29/15 1322  . norepinephrine (LEVOPHED) 4mg  in D5W 250mL premix infusion  0-20 mcg/min Intravenous Titrated Canary BrimMary W Larach, NP   Stopped at 11/29/15 1236  . ondansetron (ZOFRAN) tablet 4 mg  4 mg Oral Q6H PRN  Katharina Caperima Vaickute, MD       Or  . ondansetron (ZOFRAN) injection 4 mg  4 mg Intravenous Q6H PRN Katharina Caperima Vaickute, MD      . piperacillin-tazobactam (ZOSYN) IVPB 3.375 g  3.375 g Intravenous Q12H Erin FullingKurian Kasa, MD   3.375 g at 11/29/15 1111  . sodium chloride flush (NS) 0.9 % injection 3 mL  3 mL Intravenous Q12H Katharina Caperima Vaickute, MD   3 mL at 11/29/15 1114     Discharge Medications: Please see discharge summary for a list of discharge medications.  Relevant Imaging Results:  Relevant Lab Results:   Additional Information    York SpanielMonica Ritvik Mczeal, LCSW

## 2015-11-29 NOTE — Progress Notes (Signed)
Dr Imogene Burnchen notified earlier of pts  Swollen reddened left arm. Order for  Warm  k pad ordered.  Elevated pts  Lt arm on pillow. Pt resting quietly.

## 2015-11-29 NOTE — Progress Notes (Signed)
PULMONARY / CRITICAL CARE MEDICINE   Name: Katherine SalinaFrances A Boschert MRN: [id number] DOB: 05-04-1931    ADMISSION DATE:  11/27/2015  CHIEF COMPLAINT:  AMS, septic shock, hypernatremia and impending respiratory failure  HISTORY OF PRESENT ILLNESS:   82 year old female past medical history of dementia, COPD, recent hypertension, osteoporosis, recent admission to the hospital after several mechanical falls with lumbar compression fracture and repeat fractures in June 2017, readmitted back to the hospital for unresponsiveness, septic shock, lactic acidosis, impending respiratory failure, therefore PCCM was consulted.  Initially called by ER physician about this patient, stating that patient was mildly responsive, but in no impending respiratory failure, she received 2.5 L of fluid resuscitation, and was on her third liter when her blood pressures started to improve and systolics in the 70s, she had a lactic acid initially of 8.3, repeat lactic acid was 5.8, at that point given the improvement in the patient's blood pressure response to fluids, ER physician stated that hospitalists would admit patient, and if there are any test, do a consult CCM. Shortly after evaluation by hospitalist, patient was noted to be in septic shock, requiring low dose pressors, only responsive to sternal rub, and CCM was consulted. Upon my evaluation, the patient with open mouth breathing, not responding to verbal stimuli, grimacing to sternal rub only. Spoke with patient's healthcare power of attorney who is her daughter over the phone and in person, who stated that Patient with declining health status over the past couple of months, multiple falls, dementia, and at this time she wants medical treatment with DO NOT RESUSCITATE status. Further history is as follows, she was recently admitted to [facility], discharged in mid June, transferred to [facility] facility over the past 2 weeks, has had worsening dementia, but still  oriented to self, noted to have shortness of breath and wheezing on Friday, and today nursing staff found the patient mild responsive and hypoxic, she was placed on nasal cannula and EMS was called, difficult to obtain blood pressure per EMS records. On arrival to the ED she was given 2.5 L of normal saline as part of IV fluid resuscitation, she was noted to have a significant hypernatremia and significant worsening renal function.  SUBJECTIVE:  No acute issues overnight. Remains acutely iill, remains obtunded; mourns and groans with position changes On vasopressors, prognosis is poor Recommend comfort care measures, palliative care to meet with family today at 1030AM  VITAL SIGNS: BP 116/50 mmHg  Pulse 83  Temp(Src) 99 F (37.2 C) (Core (Comment))  Resp 17  Ht 5\' 5"  (1.651 m)  Wt 115 lb 8.3 oz (52.4 kg)  BMI 19.22 kg/m2  SpO2 100%   INTAKE / OUTPUT: I/O last 3 completed shifts: In: 5405.2 [I.V.:4745.2; IV Piggyback:660] Out: 4326 [Urine:4325; Stool:1]  PHYSICAL EXAMINATION: General: Acutely ill looking HEENT: PERRLA, oral mucosa dry Neuro: Withdraws to pain, not following commands Cardiovascular: NSR, s1/s2, no MRG Lungs: Labored, bilateral airflow, no wheezes Abdomen:  Non distended, hypoactive bowel sounds, diffuse tenderness on palpation GU: Foley cath - thick whitish fluid with layering the foley bag   Musculoskeletal:+rom Extremities: +2 pulses Skin:  Poor skin turgor  LABS:  BMET  Recent Labs Lab 11/28/15 0519 11/28/15 1845 11/29/15 0428  NA 157* 151* 151*  K 3.0* 3.0* 4.0  CL 122* 115* 114*  CO2 28 27 29   BUN 115* 94* 80*  CREATININE 3.23* 2.35* 2.01*  GLUCOSE 154* 161* 161*    Electrolytes  Recent Labs Lab 11/28/15 0519 11/28/15 1845  11/29/15 0428  CALCIUM 6.4* 6.5* 7.0*  MG 2.3  --   --   PHOS 2.1*  --   --     CBC  Recent Labs Lab 11/27/15 1248 11/27/15 1734 11/28/15 0519  WBC 20.0* 22.2* 14.0*  HGB 17.0* 13.6 12.3  HCT 51.8* 43.1  36.8  PLT 368 221 195    Coag's  Recent Labs Lab 11/27/15 1554  APTT <24*  INR 1.73    Sepsis Markers  Recent Labs Lab 11/27/15 1248 11/27/15 1554 11/28/15 0529  LATICACIDVEN 8.2* 5.8* 2.3*    ABG  Recent Labs Lab 11/27/15 1704 11/28/15 0437  PHART 7.29* 7.55*  PCO2ART 29* 34  PO2ART 127* 115*    Liver Enzymes  Recent Labs Lab 11/27/15 1330 11/28/15 0529  AST 63*  --   ALT 56*  --   ALKPHOS 125  --   BILITOT 0.9  --   ALBUMIN 2.5* 2.0*    Cardiac Enzymes  Recent Labs Lab 11/27/15 1734 11/27/15 2259 11/28/15 0519  TROPONINI 0.06* 0.08* 0.14*    Glucose  Recent Labs Lab 11/28/15 1115 11/28/15 1622 11/28/15 2033 11/28/15 2350 11/29/15 0356 11/29/15 0713  GLUCAP 189* 153* 145* 139* 145* 131*    Imaging No results found.  STUDIES:  None  CULTURES: BCx2 7/9> UC 7/9> Sputum  ANTIBIOTICS: Zosyn 7/9 Vanc 7/9>7/10  SIGNIFICANT EVENTS:  7/7>>Recently transferred to [place] nursing facility, decreased appetite over the past week, increased shortness of breath, worsening dementia over the last couple of days 07/09: Found unresponsive today with severe hyponatremia, dehydration and MOF; made DNR/DNI  LINES/TUBES: PIVs  DISCUSSION: 82 year old with past medical history of dementia, COPD, hypertension, osteoporosis, now with severe sepsis, metabolic acidosis, hypernatremia, metabolic encephalopathy, acute renal failure, impending respiratory failure, septic shock, possibly due to urinary tract infection and dehydration.  ASSESSMENT / PLAN:  PULMONARY A: Acute respiratory failure secondary to severe metabolic acidosis, sepsis and septic shock Severe metabolic acidosis-Improved with IV fluids and bicarb infusion P:   -PRN morphine for increased WOB -Trend lactic acid level -ABG prn  CARDIOVASCULAR A:  Severe septic shock Multiorgan failure Elevated troponin Afib-Now in NSR P:  -Continue IV fluids-D5W -Low dose levophed  and titrate to MAP >65 -Hemodynamics per ICU protocol  RENAL A:   Acute renal failure-Creatinine improving Hypernatremia Hypokalemia Hypocalcemia Hypophosphatemia P:   -Monitor and correct electrolyte   -Trend creatinine -No CRRT per family -Renally dose medications   GASTROINTESTINAL A:   Constipation-improved; 2 bowel movements since admission P:   -IV fluids -prn laxatives  INFECTIOUS A:   UROSEPSIS P:   -F/U Cultures -Empiric antibiotics  NEUROLOGIC A:   Acute metabolic encephalopathy secondary to sepsis and uremia H/o Dementia P:   RASS goal: n/a -Monitor mental status -Suportive care, pain control   Disposition and family update: No family at bedside. Will update once available  Best Practice: Code Status:  DNR Diet: Heart Healthy / Carb Mod. GI prophylaxis:  PPI. VTE prophylaxis:  SCD's / heparin.   The Patient requires high complexity decision making for assessment and support, frequent evaluation and titration of therapies, application of advanced monitoring technologies and extensive interpretation of multiple databases. Critical Care Time devoted to patient care services described in this note is 35 minutes.   Overall, patient is critically ill, prognosis is guarded.  Patient with Multiorgan failure and at high risk for cardiac arrest and death.  Recommend comfort care measures  Lucie Leather, M.D.  [facility]  Medical Director [place] Director [facility] Cardio-Pulmonary Department

## 2015-11-29 NOTE — Progress Notes (Signed)
Pt noted to have loose stool leaking around soft stool. Pt disimpacted with some noted relief. Fayth Trefry E 1:54 PM 11/29/2015

## 2015-11-29 NOTE — Progress Notes (Signed)
MEDICATION RELATED CONSULT NOTE - INITIAL   Pharmacy Consult for Electrolyte replacement Indication: electrolytes  No Known Allergies  Patient Measurements: Height:  (165.1 cm) Weight: 115 lb 8.3 oz (52.4 kg) IBW/kg (Calculated) : 57  Vital Signs: Temp: 99 F (37.2 C) (07/11 0800) Temp Source: Core (Comment) (07/11 0800) BP: 116/50 mmHg (07/11 0800) Pulse Rate: 83 (07/11 0800) Intake/Output from previous day: 07/10 0701 - 07/11 0700 In: 2743 [I.V.:2133; IV Piggyback:610] Out: 3001 [Urine:3000; Stool:1] Intake/Output from this shift: Total I/O In: -  Out: 200 [Urine:200]  Labs:  Recent Labs  11/27/15 1248  11/27/15 1330 11/27/15 1554 11/27/15 1734  11/28/15 0519 11/28/15 0529 11/28/15 1845 11/29/15 0428  WBC 20.0*  --   --   --  22.2*  --  14.0*  --   --   --   HGB 17.0*  --   --   --  13.6  --  12.3  --   --   --   HCT 51.8*  --   --   --  43.1  --  36.8  --   --   --   PLT 368  --   --   --  221  --  195  --   --   --   APTT  --   --   --  <24*  --   --   --   --   --   --   CREATININE  --   < > 5.80*  --  4.89*  < > 3.23*  --  2.35* 2.01*  MG  --   --   --   --   --   --  2.3  --   --   --   PHOS  --   --   --   --   --   --  2.1*  --   --   --   ALBUMIN  --   --  2.5*  --   --   --   --  2.0*  --   --   PROT  --   --  5.7*  --   --   --   --   --   --   --   AST  --   --  63*  --   --   --   --   --   --   --   ALT  --   --  56*  --   --   --   --   --   --   --   ALKPHOS  --   --  125  --   --   --   --   --   --   --   BILITOT  --   --  0.9  --   --   --   --   --   --   --   < > = values in this interval not displayed. Estimated Creatinine Clearance: 17.5 mL/min (by C-G formula based on Cr of 2.01).  Assessment:  Pharmacy consulted for electrolyte management for 82 yo female ICU patient with acute renal failure. Patient received potassium phosphate  IV x 1, potassium chloride IV q1hr x 4, and calcium gluconate 1g IV x 2 on 7/10.      Plan:  No replacement warranted at this time. Will obtain follow-up electrolytes with am labs.  Pharmacy will continue to monitor and adjust per consult.   Orla Estrin L 11/29/2015,8:45 AM

## 2015-11-30 LAB — GLUCOSE, CAPILLARY
Glucose-Capillary: 133 mg/dL — ABNORMAL HIGH (ref 65–99)
Glucose-Capillary: 120 mg/dL — ABNORMAL HIGH (ref 65–99)

## 2015-11-30 LAB — BASIC METABOLIC PANEL
Anion gap: 6 (ref 5–15)
BUN: 49 mg/dL — AB (ref 6–20)
CHLORIDE: 113 mmol/L — AB (ref 101–111)
CO2: 26 mmol/L (ref 22–32)
CREATININE: 1.25 mg/dL — AB (ref 0.44–1.00)
Calcium: 6.9 mg/dL — ABNORMAL LOW (ref 8.9–10.3)
GFR calc Af Amer: 45 mL/min — ABNORMAL LOW (ref >60)
GFR calc non Af Amer: 39 mL/min — ABNORMAL LOW (ref >60)
Glucose, Bld: 139 mg/dL — ABNORMAL HIGH (ref 65–99)
Potassium: 3.4 mmol/L — ABNORMAL LOW (ref 3.5–5.1)
Sodium: 145 mmol/L (ref 135–145)

## 2015-11-30 LAB — MAGNESIUM: Magnesium: 2 mg/dL (ref 1.7–2.4)

## 2015-11-30 LAB — URINE CULTURE: Culture: >=100000 — AB

## 2015-11-30 LAB — PHOSPHORUS: Phosphorus: 2.4 mg/dL — ABNORMAL LOW (ref 2.5–4.6)

## 2015-11-30 MED ORDER — POTASSIUM PHOSPHATES 15 MMOLE/5ML IV SOLN
10.0000 mmol | Freq: Once | INTRAVENOUS | Status: DC
  Filled 2015-11-30: qty 3.33

## 2015-11-30 NOTE — Progress Notes (Signed)
Daily Progress Note   Patient Name: Katherine Bartlett       Date: 11/30/2015 DOB: 1931/03/29  Age: 82 y.o. MRN#: [id number] Attending Physician: Erin FullingKurian Kasa, MD Primary Care Physician: Jaclyn ShaggyATE,DENNY C, MD Admit Date: 11/27/2015  Reason for Consultation/Follow-up: Establishing goals of care, terminal care  Subjective:  -spoke with daughter [name] by telephone, family has decided to shift to full comfort and request a bed at a hospice facility for EOL care.  Explained procedure to request bed.   Length of Stay: 3  Current Medications: Scheduled Meds:  . antiseptic oral rinse  7 mL Mouth Rinse q12n4p  . chlorhexidine  15 mL Mouth Rinse BID  . potassium phosphate IVPB (mmol)  10 mmol Intravenous Once  . sodium chloride flush  3 mL Intravenous Q12H    Continuous Infusions: . dextrose 75 mL/hr at 11/30/15 0634    PRN Meds: acetaminophen **OR** acetaminophen, ipratropium-albuterol, LORazepam, morphine injection, ondansetron **OR** ondansetron (ZOFRAN) IV  Physical Exam  Constitutional: She appears cachectic.  minimally responsive to gentle touch and verbal stimuli  HENT:  Mouth/Throat: Mucous membranes are dry.  Cardiovascular: Normal rate, regular rhythm and normal heart sounds.   Pulmonary/Chest: She has decreased breath sounds in the right lower field and the left lower field.  Skin: Skin is warm and dry.            Vital Signs: BP 90/32 mmHg  Pulse 84  Temp(Src) 97.9 F (36.6 C) (Oral)  Resp 20  Ht 5\' 5"  (1.651 m)  Wt 56.7 kg (125 lb)  BMI 20.80 kg/m2  SpO2 98% SpO2: SpO2: 98 % O2 Device: O2 Device: Nasal Cannula O2 Flow Rate: O2 Flow Rate (L/min): 2 L/min  Intake/output summary:  Intake/Output Summary (Last 24 hours) at 11/30/15 1039 Last data filed at 11/30/15 0500  Gross per 24 hour  Intake 888.88 ml  Output   1300 ml  Net -411.12 ml   LBM: Last BM Date: 11/29/15 Baseline Weight: Weight: 51.256 kg (113 lb) Most recent weight: Weight: 56.7 kg (125 lb)       Palliative Assessment/Data: 10 %    Flowsheet Rows        Most Recent Value   Intake Tab    Referral Department  Nephrology   Unit at Time of Referral  ICU   Palliative  Care Primary Diagnosis  Nephrology   Date Notified  11/27/15   Palliative Care Type  New Palliative care   Reason for referral  Clarify Goals of Care   Date of Admission  11/27/15   Date first seen by Palliative Care  11/29/15   # of days Palliative referral response time  2 Day(s)   # of days IP prior to Palliative referral  0   Clinical Assessment    Psychosocial & Spiritual Assessment    Palliative Care Outcomes       Patient Active Problem List   Diagnosis Date Noted  . DNR (do not resuscitate) 11/29/2015  . Acute renal failure (HCC)   . Arterial hypotension   . Pain, generalized   . Palliative care encounter   . Sepsis (HCC) 11/27/2015  . Acute pyelonephritis 11/27/2015  . Metabolic acidosis 11/27/2015  . Lactic acidosis 11/27/2015  . Hypernatremia 11/27/2015  . Hyperkalemia 11/27/2015  . Hyperglycemia 11/27/2015  . Elevated troponin 11/27/2015  . Leukocytosis 11/27/2015  . Dehydration 11/27/2015  . Coagulopathy (HCC) 11/27/2015  . Rib fracture 11/06/2015  . UTI (lower urinary tract infection) 07/14/2015  . Fracture of cervical vertebra (HCC) 09/11/2012  . Chronic obstructive pulmonary disease (HCC) 09/11/2012  . BP (high blood pressure) 09/11/2012    Palliative Care Assessment & Plan    Assessment:  - Continued overall failure to thrive, no  po intake, minimally responsive, prognosis is likely hrs  to days  -family hope for hospice facility   Recommendations/Plan:  Shift to comfort, focus on comfort  Goals of Care and Additional Recommendations:  Limitations on Scope of Treatment:  Full Comfort Care  Code Status:    Code Status Orders        Start     Ordered   11/27/15 1906  Do not attempt resuscitation (DNR)   Continuous    Question Answer Comment  In the event of cardiac or respiratory ARREST Do not call a "code blue"   In the event of cardiac or respiratory ARREST Do not perform Intubation, CPR, defibrillation or ACLS   In the event of cardiac or respiratory ARREST Use medication by any route, position, wound care, and other measures to relive pain and suffering. May use oxygen, suction and manual treatment of airway obstruction as needed for comfort.      11/27/15 1905    Code Status History    Date Active Date Inactive Code Status Order ID Comments User Context   11/27/2015  5:08 PM 11/27/2015  7:05 PM Full Code [id number]  Katharina Caper, MD Inpatient   11/07/2015 12:27 AM 11/09/2015 10:42 PM Full Code [id number]  Delfino Lovett, MD Inpatient   07/14/2015 11:35 PM 07/18/2015 12:15 AM Full Code [id number]  Milagros Loll, MD ED       Prognosis:   Hours - Days  Discharge Planning:  Hospice facility  Care plan was discussed with  and SW, paged Dr Belia Heman  Thank you for allowing the Palliative Medicine Team to assist in the care of this patient.   Time In:  1030 Time Out: 1055 Total Time 25 min Prolonged Time Billed  no       Greater than 50%  of this time was spent counseling and coordinating care related to the above assessment and plan.  Lorinda Creed, NP  Please contact Palliative Medicine Team phone at [phone] for questions and concerns.

## 2015-11-30 NOTE — Progress Notes (Signed)
MEDICATION RELATED CONSULT NOTE - INITIAL   Pharmacy Consult for Electrolyte replacement Indication: electrolytes  No Known Allergies  Patient Measurements: Height: 5\' 5"  (165.1 cm) Weight: 125 lb (56.7 kg) IBW/kg (Calculated) : 57  Vital Signs: Temp: 97.9 F (36.6 C) (07/12 0439) Temp Source: Oral (07/12 0439) BP: 90/32 mmHg (07/12 0439) Pulse Rate: 84 (07/12 0439) Intake/Output from previous day: 07/11 0701 - 07/12 0700 In: 888.9 [I.V.:838.9; IV Piggyback:50] Out: 1500 [Urine:1500] Intake/Output from this shift:    Labs:  Recent Labs  11/27/15 1248  11/27/15 1330 11/27/15 1554 11/27/15 1734  11/28/15 0519 11/28/15 0529 11/28/15 1845 11/29/15 0428 11/30/15 0446  WBC 20.0*  --   --   --  22.2*  --  14.0*  --   --   --   --   HGB 17.0*  --   --   --  13.6  --  12.3  --   --   --   --   HCT 51.8*  --   --   --  43.1  --  36.8  --   --   --   --   PLT 368  --   --   --  221  --  195  --   --   --   --   APTT  --   --   --  <24*  --   --   --   --   --   --   --   CREATININE  --   < > 5.80*  --  4.89*  < > 3.23*  --  2.35* 2.01* 1.25*  MG  --   --   --   --   --   --  2.3  --   --   --  2.0  PHOS  --   --   --   --   --   --  2.1*  --   --   --  2.4*  ALBUMIN  --   --  2.5*  --   --   --   --  2.0*  --   --   --   PROT  --   --  5.7*  --   --   --   --   --   --   --   --   AST  --   --  63*  --   --   --   --   --   --   --   --   ALT  --   --  56*  --   --   --   --   --   --   --   --   ALKPHOS  --   --  125  --   --   --   --   --   --   --   --   BILITOT  --   --  0.9  --   --   --   --   --   --   --   --   < > = values in this interval not displayed. Estimated Creatinine Clearance: 30.5 mL/min (by C-G formula based on Cr of 1.25).  Assessment:  Pharmacy consulted for electrolyte management for 82 yo female admitted with sepsis and acute renal failure. Transferred out of ICU 7/11. Renal function improving  Plan:  Potassium and phosphorus low, patient is  NPO will give IV K phos x1. Will obtain follow-up electrolytes with am labs.   Pharmacy will continue to monitor and adjust per consult.   Crucita Lacorte C 11/30/2015,10:10 AM

## 2015-11-30 NOTE — Progress Notes (Signed)
EMS notified for transport to Hospice Home

## 2015-11-30 NOTE — Progress Notes (Signed)
New hospice home referral received from CSW Katherine QuerySarah Bartlett. Ms. Katherine Bartlett is an 82 year old woman with  A PMH of dementia, HTN, COPD, osteoporosis and cervical vertebral fracture, [facility] She was sent to the ED d/t unresponsiveness and was found to be septic. She has received IV antibiotics and required vasopressors low blood pressure support. She has continued to decline despite medical interventions and after meeting with Palliative Medicine NP Katherine Bartlett family has decided to focus on comfort with transfer to the comfort care.  Writer has placed a call to patient's daughter Katherine Bartlett [phone]) a with message left regarding transfer. Awaiting a call back.  Patient information faxed to hospice referral. Signed DNR in place in patient's cart. [facility] notified of families choice for the hospice home by CSW [employer] Morgan. Report called to the hospice home. EMS to be notified after patient's daughter returns writers call. Hospital care team all aware. Thank you. Dayna BarkerKaren Robertson RN, BSN, [facility] [place], [facility] [phone]

## 2015-11-30 NOTE — Care Management Important Message (Signed)
Important Message  Patient Details  Name: Katherine Bartlett MRN: [id number] Date of Birth: August 29, 1930   Medicare Important Message Given:  Yes    Olegario MessierKathy A Elinora Weigand 11/30/2015, 1:58 PM

## 2015-11-30 NOTE — Progress Notes (Signed)
EMS came to fetch patient to Hospice home, documentation handed over to EMT. Patient wheeled off unit via ambulance stretcher.

## 2015-11-30 NOTE — Progress Notes (Signed)
Update: Writer was able to speak with patient's daughter [name] Jeffrey's via phone at 3:00 pm. She is unable to get out of work to come to the hospital or the hospice home to sign consents. She committed to being at the hospice home to sign consents after she gets out of work this evening at 9 pm. Hospice home staff made aware. Patient will transport to the hospice home this evening.Staff RN Trula Orehristina to call for EMS transport prior to end of her shift. Thank you. Dayna BarkerKaren Robertson RN, [facility] [place], [facility] [phone]

## 2015-11-30 NOTE — Discharge Summary (Signed)
Physician Discharge Summary  Patient ID: Katherine Bartlett MRN: [id number] DOB/AGE: 02/03/1931 82 y.o.  Admit date: 11/27/2015 Discharge date: 11/30/2015    Acute respiratory failure secondary to severe metabolic acidosis, sepsis and septic shock Severe metabolic acidosis-Improved with IV fluids and bicarb infusion Severe septic shock Multiorgan failure  Acute renal failure                                                              UROSEPSIS Acute metabolic encephalopathy secondary to sepsis and uremia H/o Dementia        DISCHARGE PLAN BY DIAGNOSIS     82 yearFemale with past medical history significant for dementia, COPD, hypertension, osteoporosis, several mechanical falls, lumbar compression fracture. Patient was readmitted on 7/9 for unresponsiveness, septic shock, lactic acidosis and impending respiratory failure. Patient is with declining health status over the past couple of months. She has medical treatment with DO NOT RESUSCITATE status. Apparently over the course of this hospitalization she has not been doing well . Palliative care was consulted in her care management. She spoke with her family member and has decided to shift the focus of DO NOT RESUSCITATE to full comfort care as she has very poor prognosis. Hospice facility was contacted for end-of-life care.                DISCHARGE SUMMARY  Patient with multiple comorbidities, apparently not doing well.  Family decided to shift the goals of care to full comfort care as the patient has very poor prognosis. Patient will be moved to hospice            Discharge Exam: Constitutional: She appears cachectic.  minimally responsive to gentle touch and verbal stimuli  HENT:  Mouth/Throat: Mucous membranes are dry.  Cardiovascular: Normal rate, regular rhythm and normal heart sounds.  Pulmonary/Chest: She has decreased breath sounds in the right lower field and the left lower field.  Skin: Skin is warm and dry.      Filed Vitals:   11/29/15 1826 11/29/15 2036 11/30/15 0439 11/30/15 0500  BP: 93/46 100/45 90/32   Pulse: 77 82 84   Temp:  98.6 F (37 C) 97.9 F (36.6 C)   TempSrc:  Oral Oral   Resp: 20 20    Height:      Weight:    56.7 kg (125 lb)  SpO2: 99% 99% 98%      Discharge Labs  BMET  Recent Labs Lab 11/27/15 2259 11/28/15 0519 11/28/15 1845 11/29/15 0428 11/30/15 0446  NA 157* 157* 151* 151* 145  K 3.8 3.0* 3.0* 4.0 3.4*  CL 128* 122* 115* 114* 113*  CO2 21* GLUCOSE 273* 154* 161* 161* 139*  BUN 146* 115* 94* 80* 49*  CREATININE 4.04* 3.23* 2.35* 2.01* 1.25*  CALCIUM 6.6* 6.4* 6.5* 7.0* 6.9*  MG  --  2.3  --   --  2.0  PHOS  --  2.1*  --   --  2.4*    CBC  Recent Labs Lab 11/27/15 1248 11/27/15 1734 11/28/15 0519  HGB 17.0* 13.6 12.3  HCT 51.8* 43.1 36.8  WBC 20.0* 22.2* 14.0*  PLT 368 221 195    Anti-Coagulation  Recent Labs Lab 11/27/15 1554  INR 1.73  Medication List    ASK your doctor about these medications        acetaminophen 500 MG tablet  Commonly known as:  TYLENOL  Take 500 mg by mouth every 4 (four) hours as needed for mild pain or moderate pain. *Do not exceed 3 G in 24 hours*     albuterol 108 (90 Base) MCG/ACT inhaler  Commonly known as:  PROVENTIL HFA;VENTOLIN HFA  Inhale 2 puffs into the lungs every 6 (six) hours as needed for wheezing or shortness of breath.     alendronate 70 MG tablet  Commonly known as:  FOSAMAX  Take 70 mg by mouth once a week. Take with a full glass of water on an empty stomach.     aspirin 81 MG tablet  Take 81 mg by mouth daily.     atenolol 25 MG tablet  Commonly known as:  TENORMIN  Take 25 mg by mouth daily.     bisacodyl 5 MG EC tablet  Commonly known as:  DULCOLAX  Take 1 tablet (5 mg total) by mouth daily as needed for moderate constipation.     CALCIUM 600+D 600-400 MG-UNIT tablet  Generic drug:  Calcium Carbonate-Vitamin D  Take 1 tablet by mouth 2  (two) times daily.     docusate sodium 100 MG capsule  Commonly known as:  COLACE  Take 1 capsule (100 mg total) by mouth 2 (two) times daily.     HYDROcodone-acetaminophen 5-325 MG tablet  Commonly known as:  NORCO/VICODIN  Take 1-2 tablets by mouth every 4 (four) hours as needed for moderate pain.     ipratropium-albuterol 0.5-2.5 (3) MG/3ML Soln  Commonly known as:  DUONEB  Take 3 mLs by nebulization every 6 (six) hours as needed. For shortness of breath/wheezing.     tiotropium 18 MCG inhalation capsule  Commonly known as:  SPIRIVA  Place 18 mcg into inhaler and inhale daily.     Vitamin D (Ergocalciferol) 50000 units Caps capsule  Commonly known as:  DRISDOL  Take 1 capsule (50,000 Units total) by mouth every 7 (seven) days.         Disposition: Hospice   Time spent on disposition:  Greater than 45 minutes.     Bincy Varughese,AG-ACNP Pulmonary & Critical Care   STAFF NOTE: I, Dr. Lucie LeatherKurian David Jamiria Langill,  have personally reviewed patient's available data, including medical history, events of note, physical examination and test results as part of my evaluation. I have discussed with NP and other care providers such as pharmacist, RN and RRT.     The Patient requires high complexity decision making for assessment and support, frequent evaluation and titration of therapies, application of advanced monitoring technologies and extensive interpretation of multiple databases.    Lucie LeatherKurian David Flannery Cavallero, M.D.  [facility]  Medical Director [facility] Medical Director [facility] Cardio[facility] Department

## 2015-11-30 NOTE — Progress Notes (Signed)
Per [facility] patient will [facility] today. D/C Packet complete. [facility] administrator aware of above. Please reconsult if future social work needs arise. CSW signing off.   Jetta LoutBailey Morgan, LCSW [phone]

## 2015-12-02 LAB — BLOOD GAS, ARTERIAL
Acid-base deficit: 11.2 mmol/L — ABNORMAL HIGH (ref 0.0–2.0)
Allens test (pass/fail): POSITIVE — AB
Bicarbonate: 13.9 mEq/L — ABNORMAL LOW (ref 21.0–28.0)
FIO2: 0.28
O2 Saturation: 98.5 %
PATIENT TEMPERATURE: 37
PH ART: 7.29 — AB (ref 7.350–7.450)
PO2 ART: 127 mmHg — AB (ref 83.0–108.0)
pCO2 arterial: 29 mmHg — ABNORMAL LOW (ref 32.0–48.0)

## 2015-12-02 LAB — CULTURE, BLOOD (ROUTINE X 2)
CULTURE: NO GROWTH
Culture: NO GROWTH

## 2015-12-20 DEATH — deceased

## 2017-07-06 IMAGING — DX DG ABDOMEN 1V
1 series · 1 of 1 positions shown · non-contrast
Comparison: CT abdomen

CLINICAL DATA: Found unresponsive.

EXAM:
ABDOMEN - 1 VIEW

[abdomen kub]
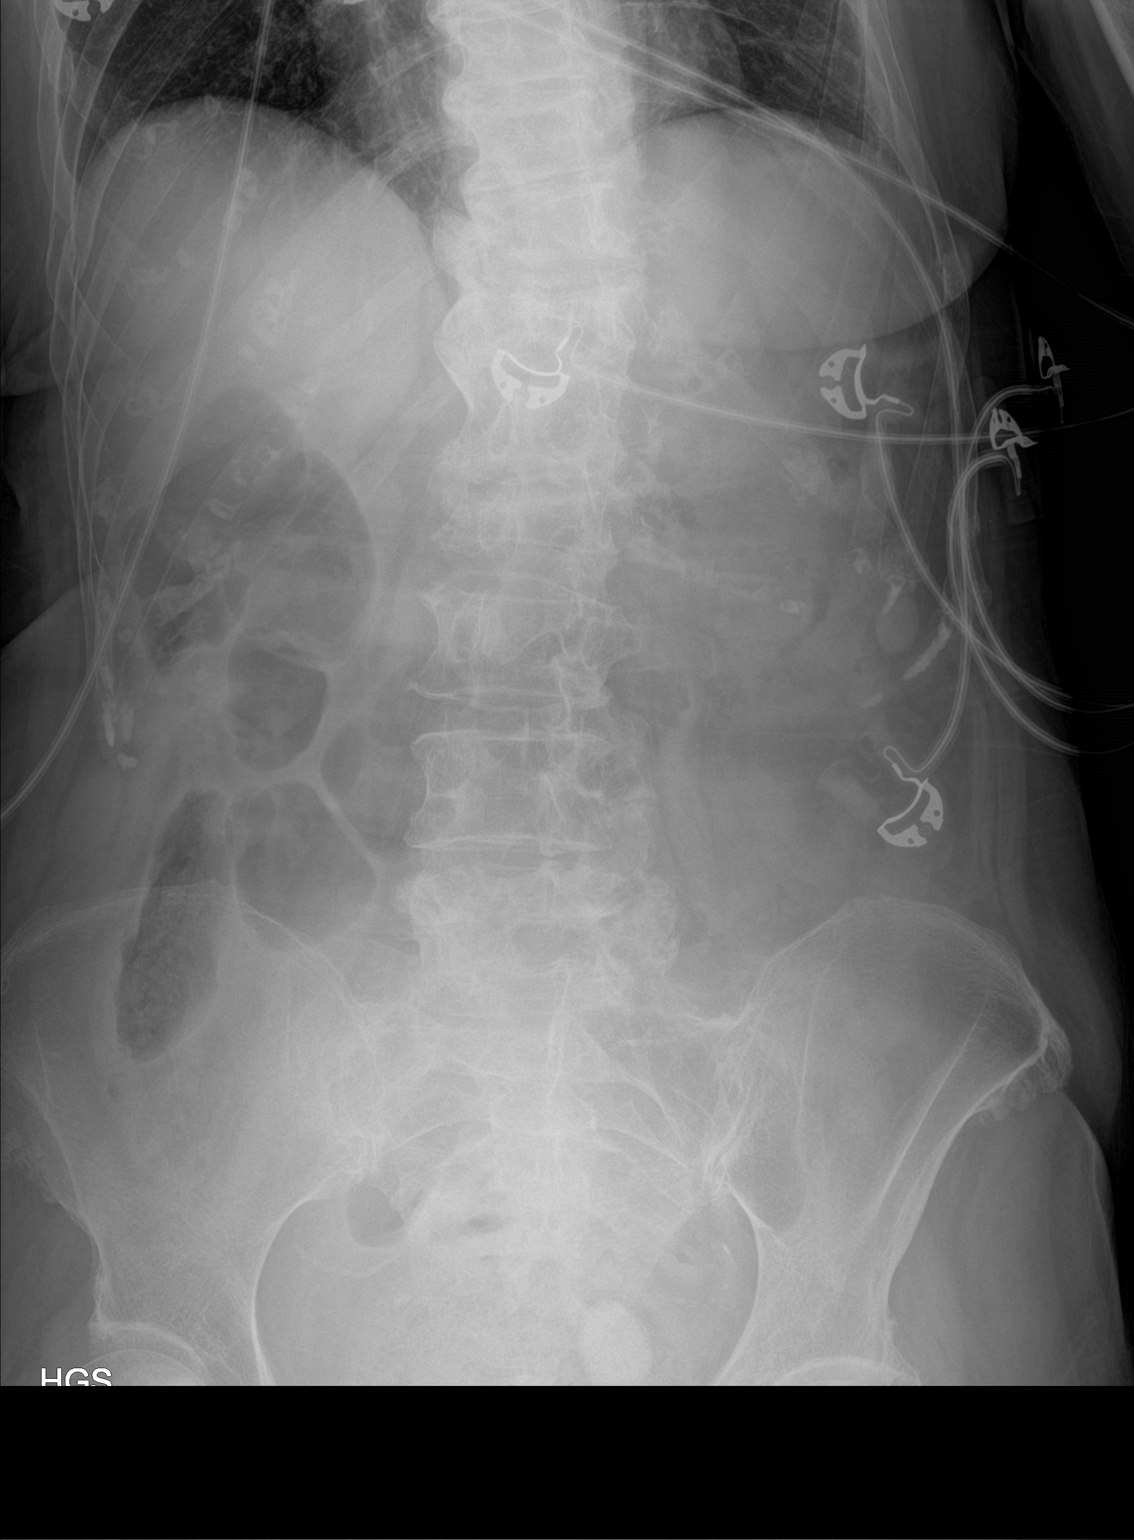

[1 of 1 positions shown; findings below may reference images not displayed]

FINDINGS: No dilated loops large or small bowel. Moderate volume stool in the
rectum. Large bladder calculus noted. No aggressive osseous lesion.
Degenerate changes spine.
IMPRESSION: No evidence of bowel obstruction.

## 2017-07-06 IMAGING — DX DG CHEST 1V PORT
1 series · 1 of 1 positions shown · non-contrast
Comparison: 11/06/2015

CLINICAL DATA: Unresponsive

EXAM:
PORTABLE CHEST 1 VIEW

[chest ap]
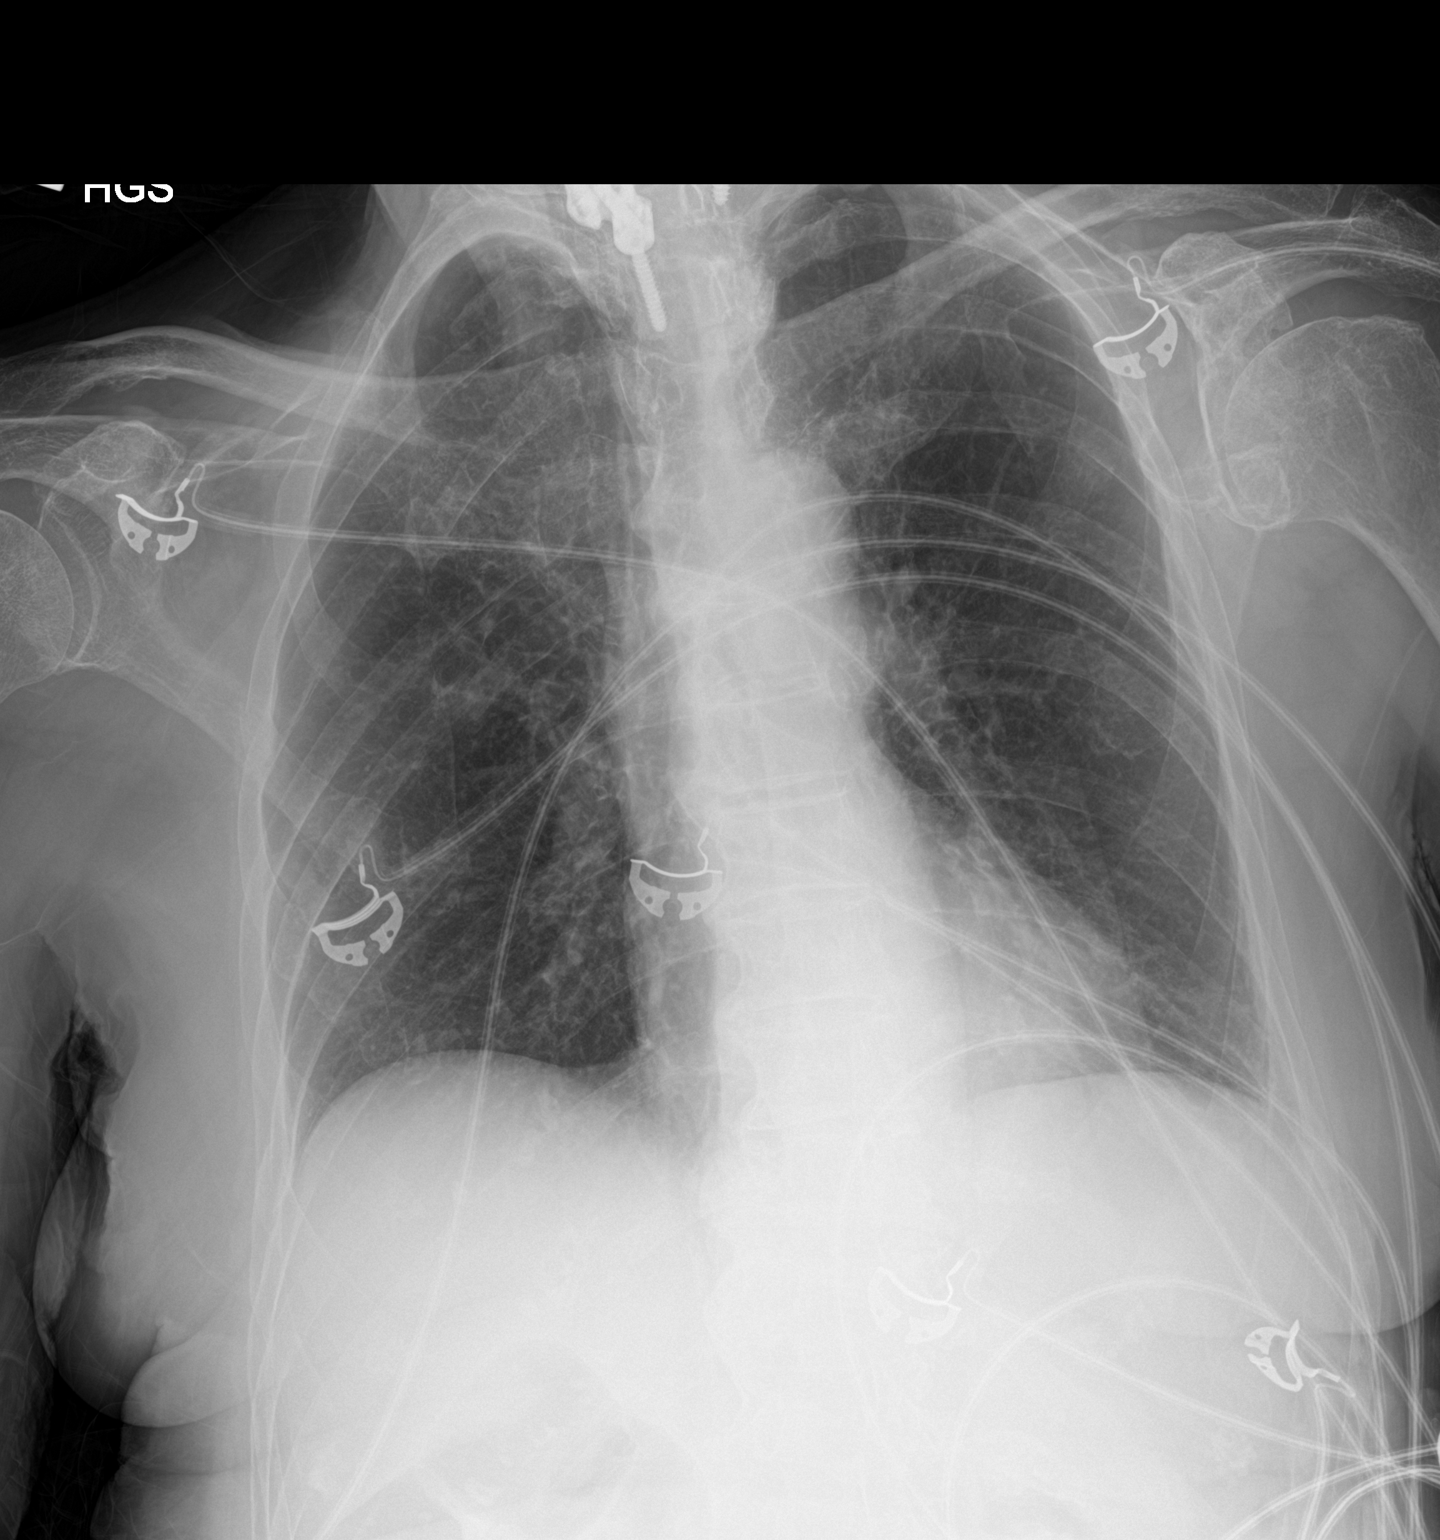

[1 of 1 positions shown; findings below may reference images not displayed]

FINDINGS: Normal heart size. Lungs clear. No pneumothorax. No pleural
effusion. Chronic right-sided rib deformities.
IMPRESSION: No active disease.

## 2017-07-06 IMAGING — CT CT HEAD W/O CM
3 of 4 series · 16 of 47 positions shown, 19 images · non-contrast
Comparison: 11/06/2015

CLINICAL DATA: Unresponsive

EXAM:
CT HEAD WITHOUT CONTRAST
TECHNIQUE: Contiguous axial images were obtained from the base of the skull
through the vertex without intravenous contrast.

[Series 2: head wo · axial · 0.40mm/px · z∈[-160,-34]mm · 10 of 31 slices shown, 13 images]
[im 3/31  brain]
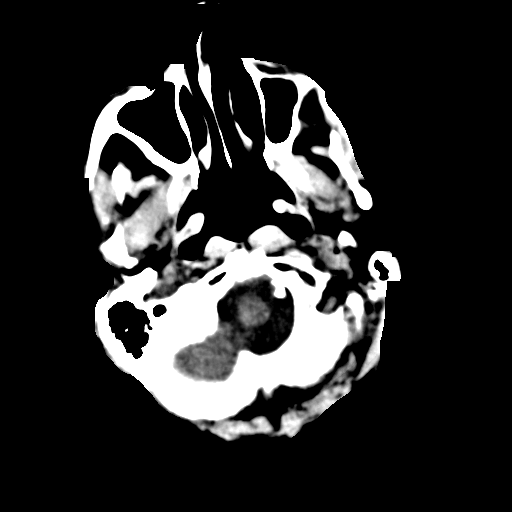
[im 3/31  bone]
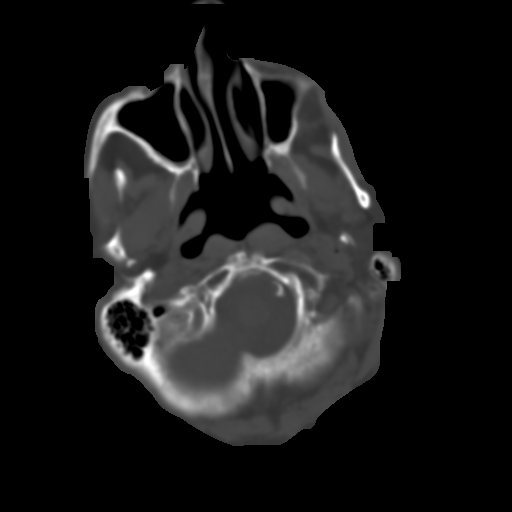
[im 5/31  brain]
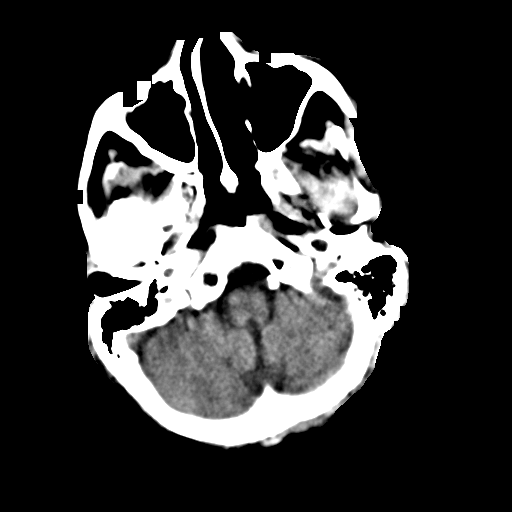
[im 9/31  brain]
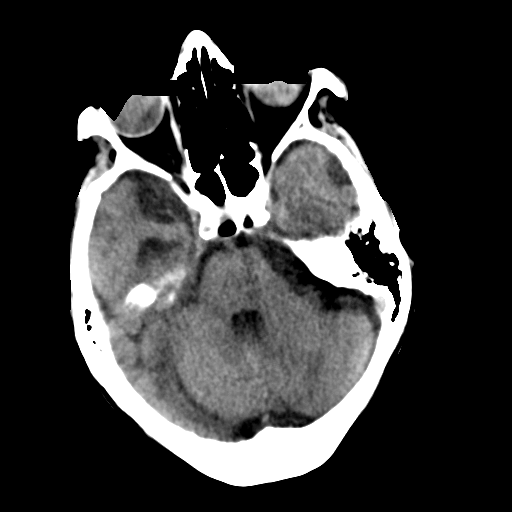
[im 11/31  brain]
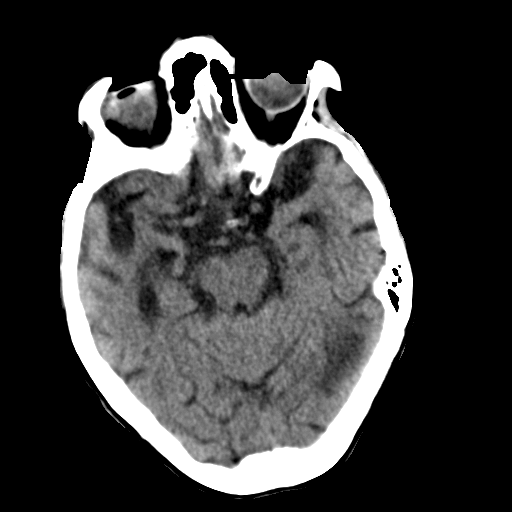
[im 13/31  brain]
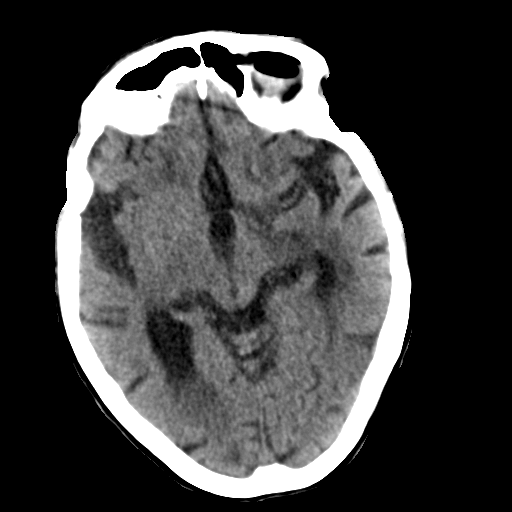
[im 13/31  bone]
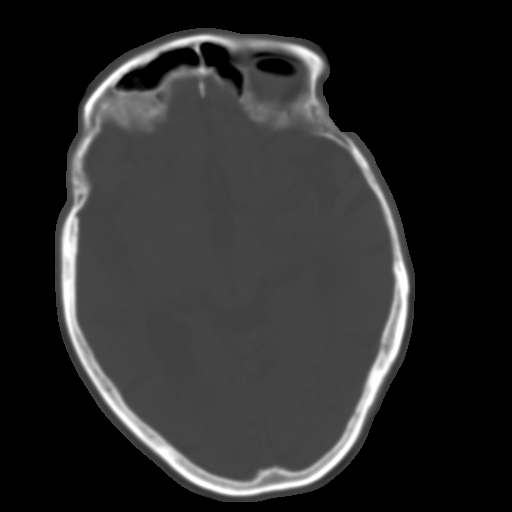
[im 18/31  brain]
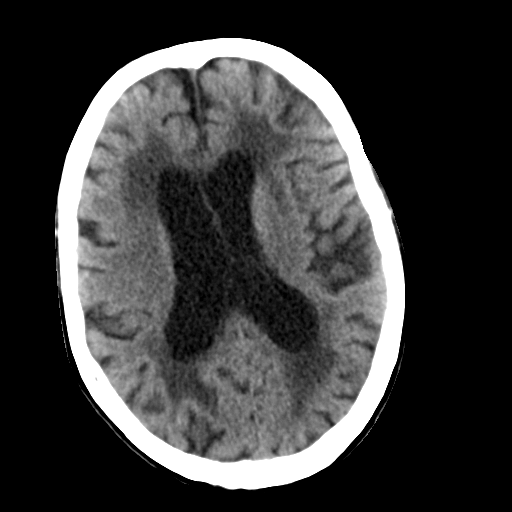
[im 20/31  brain]
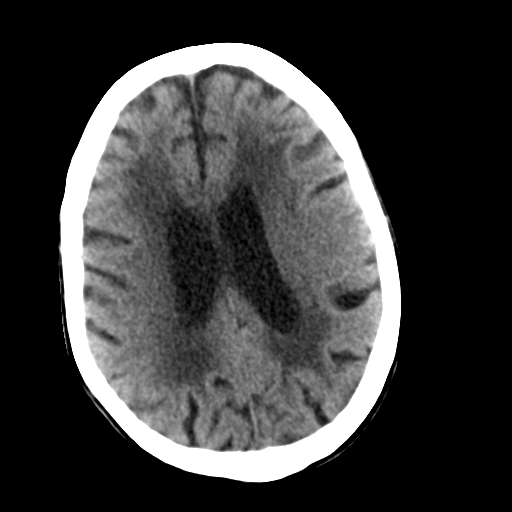
[im 22/31  brain]
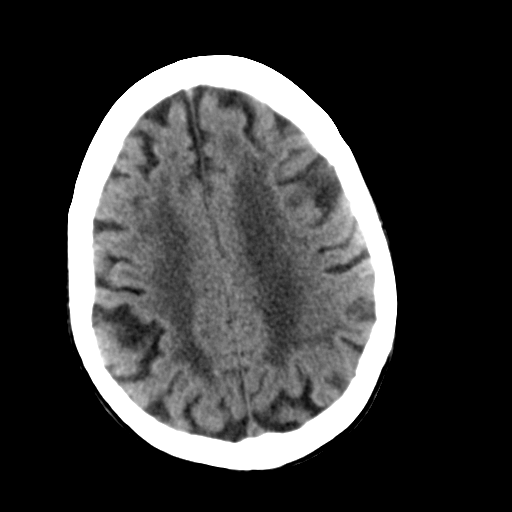
[im 26/31  brain]
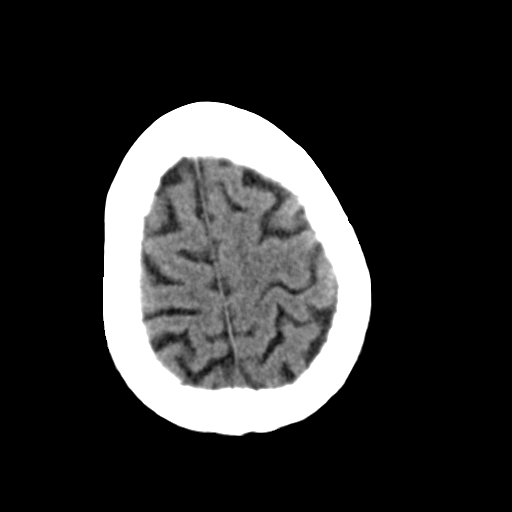
[im 26/31  bone]
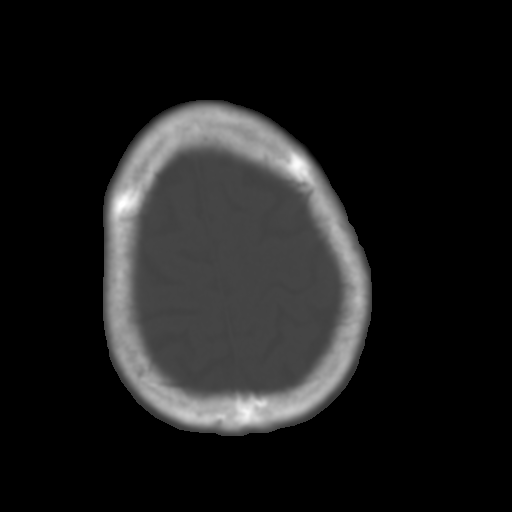
[im 28/31  brain]
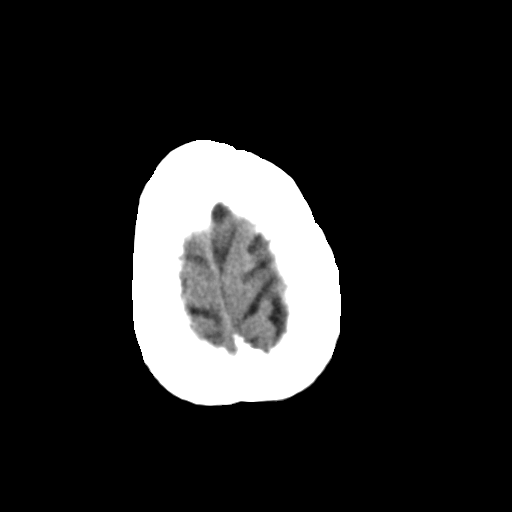

[Series 4: coronal soft · coronal · 0.33mm/px · 3 of 64 slices shown]
[im 22/64  brain]
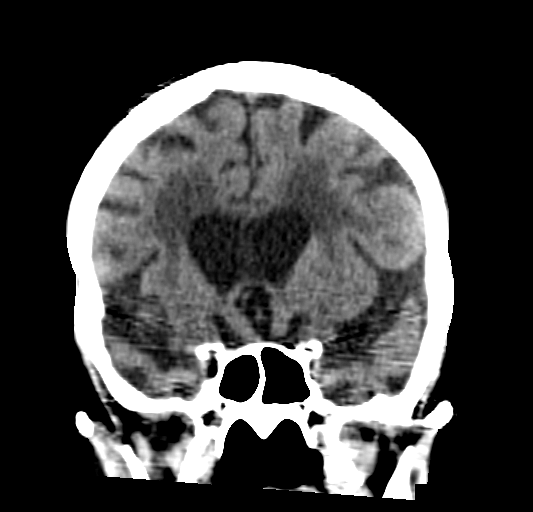
[im 29/64  brain]
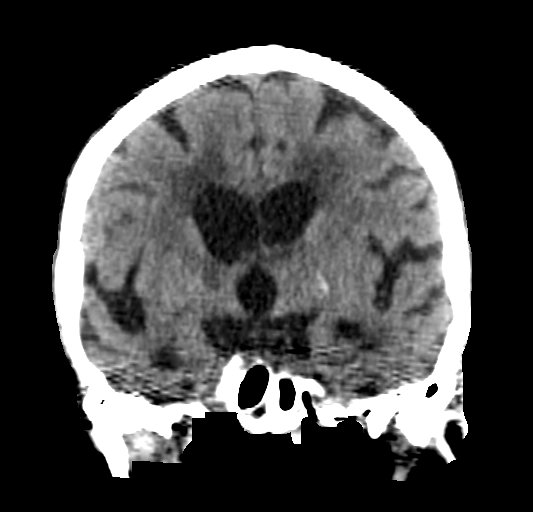
[im 36/64  brain]
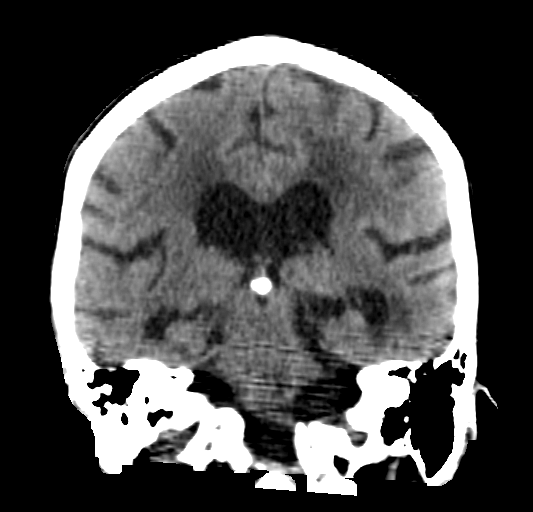

[Series 5: sagittal soft · sagittal · 0.33mm/px · 3 of 48 slices shown]
[im 16/48  brain]
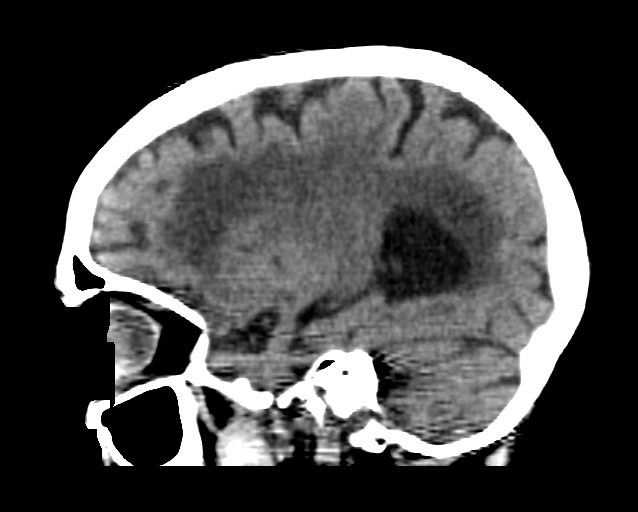
[im 24/48  brain]
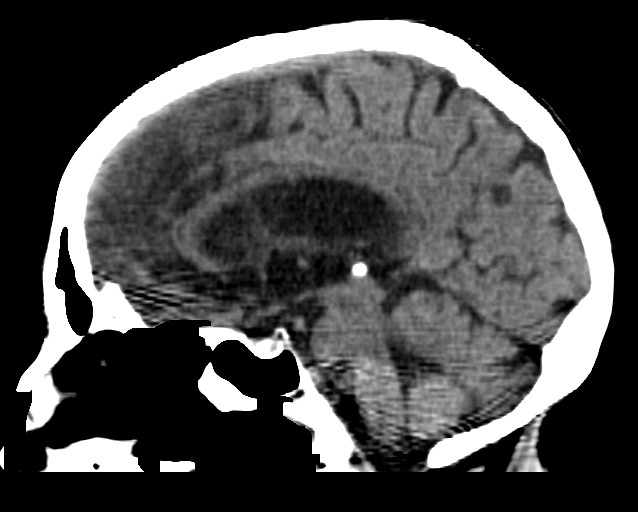
[im 32/48  brain]
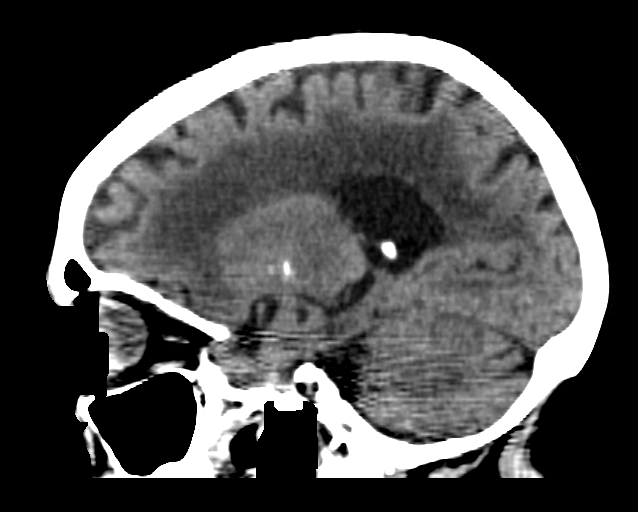

[16 of 47 positions shown; findings below may reference images not displayed]

FINDINGS: No evidence of parenchymal hemorrhage or extra-axial fluid
collection. No mass lesion, mass effect, or midline shift.

No CT evidence of acute infarction.

Subcortical white matter and periventricular small vessel ischemic
changes. Intracranial atherosclerosis.

Old left thalamic and right basal ganglia lacunar infarcts.

Global cortical and central atrophy. Secondary ventricular
prominence.

The visualized paranasal sinuses are essentially clear. The mastoid
air cells are unopacified.

No evidence of calvarial fracture.
IMPRESSION: No evidence of acute intracranial abnormality.

Atrophy with small vessel ischemic changes.

Old lacunar infarcts, as above.
